# Patient Record
Sex: Male | Born: 1970 | Race: White | Hispanic: No | Smoking: Light tobacco smoker
Health system: Southern US, Community
[De-identification: ages and names within clinical notes are randomized; demographics above are authoritative.]

## PROBLEM LIST (undated history)

## (undated) DIAGNOSIS — F319 Bipolar disorder, unspecified: Secondary | ICD-10-CM

## (undated) DIAGNOSIS — F329 Major depressive disorder, single episode, unspecified: Secondary | ICD-10-CM

## (undated) DIAGNOSIS — I2510 Atherosclerotic heart disease of native coronary artery without angina pectoris: Secondary | ICD-10-CM

## (undated) DIAGNOSIS — F32A Depression, unspecified: Secondary | ICD-10-CM

## (undated) DIAGNOSIS — I509 Heart failure, unspecified: Secondary | ICD-10-CM

## (undated) DIAGNOSIS — I10 Essential (primary) hypertension: Secondary | ICD-10-CM

## (undated) HISTORY — PX: BRAIN SURGERY: SHX531

## (undated) HISTORY — PX: ANKLE SURGERY: SHX546

---

## 2013-07-04 ENCOUNTER — Emergency Department (HOSPITAL_COMMUNITY)
Admission: EM | Admit: 2013-07-04 | Discharge: 2013-07-05 | Disposition: A | Discharge status: Other Acute Inpatient Hospital | Payer: No Typology Code available for payment source | Attending: Emergency Medicine | Admitting: Emergency Medicine

## 2013-07-04 ENCOUNTER — Emergency Department (HOSPITAL_COMMUNITY): Payer: No Typology Code available for payment source

## 2013-07-04 ENCOUNTER — Encounter (HOSPITAL_COMMUNITY): Payer: Self-pay | Admitting: Emergency Medicine

## 2013-07-04 DIAGNOSIS — F319 Bipolar disorder, unspecified: Secondary | ICD-10-CM | POA: Insufficient documentation

## 2013-07-04 DIAGNOSIS — I2510 Atherosclerotic heart disease of native coronary artery without angina pectoris: Secondary | ICD-10-CM | POA: Insufficient documentation

## 2013-07-04 DIAGNOSIS — F3130 Bipolar disorder, current episode depressed, mild or moderate severity, unspecified: Secondary | ICD-10-CM

## 2013-07-04 DIAGNOSIS — M109 Gout, unspecified: Secondary | ICD-10-CM | POA: Insufficient documentation

## 2013-07-04 DIAGNOSIS — I10 Essential (primary) hypertension: Secondary | ICD-10-CM | POA: Insufficient documentation

## 2013-07-04 DIAGNOSIS — I509 Heart failure, unspecified: Secondary | ICD-10-CM | POA: Insufficient documentation

## 2013-07-04 DIAGNOSIS — F411 Generalized anxiety disorder: Secondary | ICD-10-CM | POA: Insufficient documentation

## 2013-07-04 DIAGNOSIS — F329 Major depressive disorder, single episode, unspecified: Secondary | ICD-10-CM | POA: Diagnosis present

## 2013-07-04 DIAGNOSIS — F39 Unspecified mood [affective] disorder: Secondary | ICD-10-CM | POA: Insufficient documentation

## 2013-07-04 DIAGNOSIS — F3289 Other specified depressive episodes: Secondary | ICD-10-CM

## 2013-07-04 HISTORY — DX: Essential (primary) hypertension: I10

## 2013-07-04 HISTORY — DX: Heart failure, unspecified: I50.9

## 2013-07-04 HISTORY — DX: Bipolar disorder, unspecified: F31.9

## 2013-07-04 HISTORY — DX: Atherosclerotic heart disease of native coronary artery without angina pectoris: I25.10

## 2013-07-04 LAB — RAPID URINE DRUG SCREEN, HOSP PERFORMED
Amphetamines: NOT DETECTED
BARBITURATES: NOT DETECTED
Benzodiazepines: NOT DETECTED
Cocaine: POSITIVE — AB
Opiates: NOT DETECTED
Tetrahydrocannabinol: NOT DETECTED

## 2013-07-04 LAB — ETHANOL: ALCOHOL ETHYL (B): 49 mg/dL — AB (ref 0–11)

## 2013-07-04 MED ORDER — COLCHICINE 0.6 MG PO TABS
0.6000 mg | ORAL_TABLET | Freq: Once | ORAL | Status: AC
  Administered 2013-07-04: 0.6 mg via ORAL
  Filled 2013-07-04: qty 1

## 2013-07-04 MED ORDER — METOPROLOL TARTRATE 25 MG PO TABS
25.0000 mg | ORAL_TABLET | Freq: Two times a day (BID) | ORAL | Status: DC
  Administered 2013-07-04 – 2013-07-05 (×3): 25 mg via ORAL
  Filled 2013-07-04 (×3): qty 1

## 2013-07-04 MED ORDER — FLUOXETINE HCL 20 MG PO CAPS
20.0000 mg | ORAL_CAPSULE | Freq: Every day | ORAL | Status: DC
  Administered 2013-07-04 – 2013-07-05 (×2): 20 mg via ORAL
  Filled 2013-07-04 (×3): qty 1

## 2013-07-04 MED ORDER — TRAZODONE HCL 50 MG PO TABS
50.0000 mg | ORAL_TABLET | Freq: Every day | ORAL | Status: DC
  Administered 2013-07-04: 50 mg via ORAL
  Filled 2013-07-04: qty 1

## 2013-07-04 MED ORDER — COLCHICINE 0.6 MG PO TABS: 0.6000 mg | ORAL_TABLET | Freq: Two times a day (BID) | ORAL | Status: DC | PRN

## 2013-07-04 MED ORDER — INDOMETHACIN 25 MG PO CAPS
25.0000 mg | ORAL_CAPSULE | Freq: Three times a day (TID) | ORAL | Status: DC | PRN
Start: 2013-07-04 — End: 2013-07-06

## 2013-07-04 MED ORDER — DIVALPROEX SODIUM ER 500 MG PO TB24
500.0000 mg | ORAL_TABLET | Freq: Every day | ORAL | Status: DC
  Administered 2013-07-04 – 2013-07-05 (×2): 500 mg via ORAL
  Filled 2013-07-04 (×2): qty 1

## 2013-07-04 MED ORDER — IBUPROFEN 200 MG PO TABS
600.0000 mg | ORAL_TABLET | Freq: Four times a day (QID) | ORAL | Status: DC | PRN
  Administered 2013-07-04 – 2013-07-05 (×2): 600 mg via ORAL
  Filled 2013-07-04 (×2): qty 3

## 2013-07-04 MED ORDER — IBUPROFEN 800 MG PO TABS
800.0000 mg | ORAL_TABLET | Freq: Once | ORAL | Status: AC
  Administered 2013-07-04: 800 mg via ORAL
  Filled 2013-07-04: qty 1

## 2013-07-04 MED ORDER — HYDROCODONE-ACETAMINOPHEN 5-325 MG PO TABS
1.0000 | ORAL_TABLET | Freq: Once | ORAL | Status: AC
  Administered 2013-07-04: 1 via ORAL
  Filled 2013-07-04: qty 1

## 2013-07-04 NOTE — Consult Note (Signed)
[facility] Face-to-Face Psychiatry Consult   Reason for Consult:  Depression, Gout pain Referring Physician:  EDP Andrew Chambers is an 43 y.o. male.  Assessment: AXIS I:  Bipolar, Depressed, Depressive Disorder NOS and Mood Disorder NOS AXIS II:  Deferred AXIS III:   Past Medical History  Diagnosis Date  . Bipolar 1 disorder   . CHF (congestive heart failure)   . Coronary artery disease   . Hypertension    AXIS IV:  housing problems, other psychosocial or environmental problems, problems related to social environment and problems with primary support group AXIS V:  31-40 impairment in reality testing  Plan:  Recommend psychiatric Inpatient admission when medically cleared.  Subjective:   Andrew Chambers is a 43 y.o. male patient admitted with MAJOR DEPRESSIVE D/O, SEVERE, RECURRENT.Hx of Bipolar d/o  HPI:  Patient came in to the ER with c/o of gout pain and depressive mood. Patient states " I do not want to be here any more"  Patient has a hx of suicide attempt by OD on pills long time ago.  He reports several mental health hospitalization in the past at [place] [facility]  but does not remember his diagnosis then.  Patient states he was diagnosed with Bipolar  And was started on Depakote 6 months ago.  Patient states he did not take the Depakote and did not give his reasons for not taking it.  Patient states he moved down to [place] area to stay with his mother but they are not getting along well.  Patient states he is homeless from today and will not be going back to his mother.  Patient has family in [place] but has no contact with them.  Patient states he feel hopeless, helpless and angry that things are not working out well between him and his mother.  Patient reports poor appetite and sleep.  He reports using Cocaine occasionally and his last use was last night.  He reports drinking occasionally and his last drink was las night.  His alcohol level was 49 on arrival.  Patient denies HI/AVH.   Patient appeared angry and made minimal eye contact during the interview.  We have accepted patient for admission but will seek placement at other facilities since we are out of bed at this time.  We will restart his Depakote and add Prozac to his medications.  HPI Elements:   Location:  [facility]. Quality:  SEVERE.  Past Psychiatric History: Past Medical History  Diagnosis Date  . Bipolar 1 disorder   . CHF (congestive heart failure)   . Coronary artery disease   . Hypertension     reports that he drinks alcohol. His tobacco and drug histories are not on file. History reviewed. No pertinent family history. Family History Substance Abuse: No Family Supports: Yes, List: (a Friend of the family also lives in the home) Living Arrangements: Parent (Mother is sick) Can pt return to current living arrangement?: Yes Abuse/Neglect [facility]) Physical Abuse: Denies Verbal Abuse: Denies Sexual Abuse: Denies Allergies:  No Known Allergies  ACT Assessment Complete:  Yes:    Educational Status    Risk to Self: Risk to self Suicidal Ideation: Yes-Currently Present Suicidal Intent: Yes-Currently Present Is patient at risk for suicide?: Yes Suicidal Plan?: Yes-Currently Present Specify Current Suicidal Plan: shot self with his gun, walk in front of a car, or jump off a bridge Access to Means: Yes Specify Access to Suicidal Means: has a gun at home What has been your use of drugs/alcohol within the  last 12 months?: use alcohol and cocaine used periodically Previous Attempts/Gestures: Yes How many times?: 3 Other Self Harm Risks: unknown Triggers for Past Attempts: Family contact (PT was guarded about stressors) Intentional Self Injurious Behavior: None Family Suicide History: Unknown Recent stressful life event(s): Other (Comment) (moved back home to care for Mother) Persecutory voices/beliefs?: Yes Depression: Yes Depression Symptoms: Isolating;Fatigue;Tearfulness;Feeling worthless/self pity  (agitation) Substance abuse history and/or treatment for substance abuse?: Yes (UDS positive for cocaine    BAL 49) Suicide prevention information given to non-admitted patients: Not applicable  Risk to Others: Risk to Others Homicidal Ideation: Yes-Currently Present Thoughts of Harm to Others: Yes-Currently Present Comment - Thoughts of Harm to Others: "I will either hurt myself or someone elase if I go home." Current Homicidal Intent: No Current Homicidal Plan: No (PT does have access to gun.) Access to Homicidal Means: Yes Describe Access to Homicidal Means: has gun at home Identified Victim: No History of harm to others?: No (PT reports having anger issues, unsure of history) Assessment of Violence: None Noted Violent Behavior Description: cfurrently cooperative with TTS Does patient have access to weapons?: Yes (Comment) Criminal Charges Pending?: No Does patient have a court date: No  Abuse: Abuse/Neglect Assessment (Assessment to be complete while patient is alone) Physical Abuse: Denies Verbal Abuse: Denies Sexual Abuse: Denies Exploitation of patient/patient's resources: Denies Self-Neglect: Denies  Prior Inpatient Therapy: Prior Inpatient Therapy Prior Inpatient Therapy: Yes Prior Therapy Dates: 2014 Prior Therapy Facilty/Provider(s): [place] Reason for Treatment: suicidal ideations  Prior Outpatient Therapy: Prior Outpatient Therapy Prior Outpatient Therapy:  (Unknown) Prior Therapy Dates:  (Unknown) Prior Therapy Facilty/Provider(s):  (Unknown) Reason for Treatment: medication management(?)  Additional Information: Additional Information 1:1 In Past 12 Months?: No CIRT Risk: No Elopement Risk: No Does patient have medical clearance?: Yes                  Objective: Blood pressure 159/107, pulse 101, temperature 98 F (36.7 C), temperature source Oral, resp. rate 16, SpO2 98.00%.There is no height or weight on file to calculate BMI. Results for  orders placed during the hospital encounter of 07/04/13 (from the past 72 hour(s))  URINE RAPID DRUG SCREEN (HOSP PERFORMED)     Status: Abnormal   Collection Time    07/04/13  9:26 AM      Result Value Range   Opiates NONE DETECTED  NONE DETECTED   Cocaine POSITIVE (*) NONE DETECTED   Benzodiazepines NONE DETECTED  NONE DETECTED   Amphetamines NONE DETECTED  NONE DETECTED   Tetrahydrocannabinol NONE DETECTED  NONE DETECTED   Barbiturates NONE DETECTED  NONE DETECTED   Comment:            DRUG SCREEN FOR MEDICAL PURPOSES     ONLY.  IF CONFIRMATION IS NEEDED     FOR ANY PURPOSE, NOTIFY LAB     WITHIN 5 DAYS.                LOWEST DETECTABLE LIMITS     FOR URINE DRUG SCREEN     Drug Class       Cutoff (ng/mL)     Amphetamine      1000     Barbiturate      200     Benzodiazepine   200     Tricyclics       300     Opiates          300     Cocaine  300     THC              50  ETHANOL     Status: Abnormal   Collection Time    07/04/13  9:40 AM      Result Value Range   Alcohol, Ethyl (B) 49 (*) 0 - 11 mg/dL   Comment:            LOWEST DETECTABLE LIMIT FOR     SERUM ALCOHOL IS 11 mg/dL     FOR MEDICAL PURPOSES ONLY   Labs are reviewed and are pertinent for UDS is positive for alcohol and his alcohol level is 49.  Current Facility-Administered Medications  Medication Dose Route Frequency Provider Last Rate Last Dose  . divalproex (DEPAKOTE ER) 24 hr tablet 500 mg  500 mg Oral Daily Earney Navy, NP      . FLUoxetine (PROZAC) capsule 20 mg  20 mg Oral Daily Earney Navy, NP      . metoprolol tartrate (LOPRESSOR) tablet 25 mg  25 mg Oral BID Rolland Porter, MD   25 mg at 07/04/13 1143   Current Outpatient Prescriptions  Medication Sig Dispense Refill  . colchicine 0.6 MG tablet Take 1 tablet (0.6 mg total) by mouth 2 (two) times daily as needed (for gout pain).  20 tablet  0  . indomethacin (INDOCIN) 25 MG capsule Take 1 capsule (25 mg total) by mouth 3 (three)  times daily as needed.  30 capsule  0    Psychiatric Specialty Exam:     Blood pressure 159/107, pulse 101, temperature 98 F (36.7 C), temperature source Oral, resp. rate 16, SpO2 98.00%.There is no height or weight on file to calculate BMI.  General Appearance: Casual and Fairly Groomed  Patent attorney::  poor  Speech:  Clear and Coherent and Normal Rate  Volume:  Normal  Mood:  Angry, Depressed, Hopeless, Irritable and Worthless  Affect:  Congruent, Depressed and Flat  Thought Process:  Coherent and Goal Directed  Orientation:  Full (Time, Place, and Person)  Thought Content:  NA  Suicidal Thoughts:  No  Homicidal Thoughts:  No  Memory:  Immediate;   Good Recent;   Good Remote;   Good  Judgement:  Poor  Insight:  Fair  Psychomotor Activity:  Normal  Concentration:  Good  Recall:  NA  Akathisia:  NA  Handed:  Right  AIMS (if indicated):     Assets:  Desire for Improvement Housing  Sleep:      Treatment Plan Summary:  Consult and face to face interview with Dr Armida Sans We will restart his antidepressants and his Depakote We will make referral to other facilities for available bed.  Daily contact with patient to assess and evaluate symptoms and progress in treatment Medication management  Earney Navy   PMHNP-BC 07/04/2013 5:58 PM  Patient was seen face-to-face for this evaluation along with physician extender and reviewed the information documented by physician extender and agree with the treatment plan.  Jayson Waterhouse,JANARDHAHA R. 07/04/2013 6:50 PM

## 2013-07-04 NOTE — ED Notes (Signed)
[facility] c/o ankle injury also bipolar and feels like he needs to be seen for evaluation.

## 2013-07-04 NOTE — ED Notes (Addendum)
Pt tells this RN " Every time I close my eyes I see myself  pulling the trigger or jumping off a bridge". MD Fayrene FearingJames made aware.

## 2013-07-04 NOTE — ED Notes (Signed)
Pt in dayroom with peers; no s/s of distress noted.

## 2013-07-04 NOTE — Discharge Instructions (Signed)
Bipolar Disorder °Bipolar disorder is a mental illness. The term bipolar disorder actually is used to describe a group of disorders that all share varying degrees of emotional highs and lows that can interfere with daily functioning, such as work, school, or relationships. Bipolar disorder also can lead to drug abuse, hospitalization, and suicide. °The emotional highs of bipolar disorder are periods of elation or irritability and high energy. These highs can range from a mild form (hypomania) to a severe form (mania). People experiencing episodes of hypomania may appear energetic, excitable, and highly productive. People experiencing mania may behave impulsively or erratically. They often make poor decisions. They may have difficulty sleeping. The most severe episodes of mania can involve having very distorted beliefs or perceptions about the world and seeing or hearing things that are not real (psychotic delusions and hallucinations).  °The emotional lows of bipolar disorder (depression) also can range from mild to severe. Severe episodes of bipolar depression can involve psychotic delusions and hallucinations. °Sometimes people with bipolar disorder experience a state of mixed mood. Symptoms of hypomania or mania and depression are both present during this mixed-mood episode. °SIGNS AND SYMPTOMS °There are signs and symptoms of the episodes of hypomania and mania as well as the episodes of depression. The signs and symptoms of hypomania and mania are similar but vary in severity. They include: °· Inflated self-esteem or feeling of increased self-confidence. °· Decreased need for sleep. °· Unusual talkativeness (rapid or pressured speech) or the feeling of a need to keep talking. °· Sensation of racing thoughts or constant talking, with quick shifts between topics that may or may not be related (flight of ideas). °· Decreased ability to focus or concentrate. °· Increased purposeful activity, such as work, studies,  or social activity, or nonproductive activity, such as pacing, squirming and fidgeting, or finger and toe tapping. °· Impulsive behavior and use of poor judgment, resulting in high-risk activities, such as having unprotected sex or spending excessive amounts of money. °Signs and symptoms of depression include the following:  °· Feelings of sadness, hopelessness, or helplessness. °· Frequent or uncontrollable episodes of crying. °· Lack of feeling anything or caring about anything. °· Difficulty sleeping or sleeping too much.  °· Inability to enjoy the things you used to enjoy.   °· Desire to be alone all the time.   °· Feelings of guilt or worthlessness.  °· Lack of energy or motivation.   °· Difficulty concentrating, remembering, or making decisions.  °· Change in appetite or weight beyond normal fluctuations. °· Thoughts of death or the desire to harm yourself. °DIAGNOSIS  °Bipolar disorder is diagnosed through an assessment by your caregiver. Your caregiver will ask questions about your emotional episodes. There are two main types of bipolar disorder. People with type I bipolar disorder have manic episodes with or without depressive episodes. People with type II bipolar disorder have hypomanic episodes and major depressive episodes, which are more serious than mild depression. The type of bipolar disorder you have can make an important difference in how your illness is monitored and treated. °Your caregiver may ask questions about your medical history and use of alcohol or drugs, including prescription medication. Certain medical conditions and substances also can cause emotional highs and lows that resemble bipolar disorder (secondary bipolar disorder).  °TREATMENT  °Bipolar disorder is a long-term illness. It is best controlled with continuous treatment rather than treatment only when symptoms occur. The following treatments can be prescribed for bipolar disorders: °· Medication Medication can be prescribed by  a doctor   that is an expert in treating mental disorders (psychiatrists). Medications called mood stabilizers are usually prescribed to help control the illness. Other medications are sometimes added if symptoms of mania, depression, or psychotic delusions and hallucinations occur despite the use of a mood stabilizer.  Talk therapy Some forms of talk therapy are helpful in providing support, education, and guidance. A combination of medication and talk therapy is best for managing the disorder over time. A procedure in which electricity is applied to your brain through your scalp (electroconvulsive therapy) is used in cases of severe mania when medication and talk therapy do not work or work too slowly. Document Released: 09/10/2000 Document Revised: 09/29/2012 Document Reviewed: 06/30/2012 [facility] Patient Information 2014 [employer], [place].  Gout Gout is an inflammatory arthritis caused by a buildup of uric acid crystals in the joints. Uric acid is a chemical that is normally present in the blood. When the level of uric acid in the blood is too high it can form crystals that deposit in your joints and tissues. This causes joint redness, soreness, and swelling (inflammation). Repeat attacks are common. Over time, uric acid crystals can form into masses (tophi) near a joint, destroying bone and causing disfigurement. Gout is treatable and often preventable. CAUSES  The disease begins with elevated levels of uric acid in the blood. Uric acid is produced by your body when it breaks down a naturally found substance called purines. Certain foods you eat, such as meats and fish, contain high amounts of purines. Causes of an elevated uric acid level include:  Being passed down from parent to child (heredity).  Diseases that cause increased uric acid production (such as obesity, psoriasis, and certain cancers).  Excessive alcohol use.  Diet, especially diets rich in meat and seafood.  Medicines, including  certain cancer-fighting medicines (chemotherapy), water pills (diuretics), and aspirin.  Chronic kidney disease. The kidneys are no longer able to remove uric acid well.  Problems with metabolism. Conditions strongly associated with gout include:  Obesity.  High blood pressure.  High cholesterol.  Diabetes. Not everyone with elevated uric acid levels gets gout. It is not understood why some people get gout and others do not. Surgery, joint injury, and eating too much of certain foods are some of the factors that can lead to gout attacks. SYMPTOMS   An attack of gout comes on quickly. It causes intense pain with redness, swelling, and warmth in a joint.  Fever can occur.  Often, only one joint is involved. Certain joints are more commonly involved:  Base of the big toe.  Knee.  Ankle.  Wrist.  Finger. Without treatment, an attack usually goes away in a few days to weeks. Between attacks, you usually will not have symptoms, which is different from many other forms of arthritis. DIAGNOSIS  Your caregiver will suspect gout based on your symptoms and exam. In some cases, tests may be recommended. The tests may include:  Blood tests.  Urine tests.  X-rays.  Joint fluid exam. This exam requires a needle to remove fluid from the joint (arthrocentesis). Using a microscope, gout is confirmed when uric acid crystals are seen in the joint fluid. TREATMENT  There are two phases to gout treatment: treating the sudden onset (acute) attack and preventing attacks (prophylaxis).  Treatment of an Acute Attack.  Medicines are used. These include anti-inflammatory medicines or steroid medicines.  An injection of steroid medicine into the affected joint is sometimes necessary.  The painful joint is rested. Movement can worsen  the arthritis.  You may use warm or cold treatments on painful joints, depending which works best for you.  Treatment to Prevent Attacks.  If you suffer from  frequent gout attacks, your caregiver may advise preventive medicine. These medicines are started after the acute attack subsides. These medicines either help your kidneys eliminate uric acid from your body or decrease your uric acid production. You may need to stay on these medicines for a very long time.  The early phase of treatment with preventive medicine can be associated with an increase in acute gout attacks. For this reason, during the first few months of treatment, your caregiver may also advise you to take medicines usually used for acute gout treatment. Be sure you understand your caregiver's directions. Your caregiver may make several adjustments to your medicine dose before these medicines are effective.  Discuss dietary treatment with your caregiver or dietitian. Alcohol and drinks high in sugar and fructose and foods such as meat, poultry, and seafood can increase uric acid levels. Your caregiver or dietician can advise you on drinks and foods that should be limited. HOME CARE INSTRUCTIONS   Do not take aspirin to relieve pain. This raises uric acid levels.  Only take over-the-counter or prescription medicines for pain, discomfort, or fever as directed by your caregiver.  Rest the joint as much as possible. When in bed, keep sheets and blankets off painful areas.  Keep the affected joint raised (elevated).  Apply warm or cold treatments to painful joints. Use of warm or cold treatments depends on which works best for you.  Use crutches if the painful joint is in your leg.  Drink enough fluids to keep your urine clear or pale yellow. This helps your body get rid of uric acid. Limit alcohol, sugary drinks, and fructose drinks.  Follow your dietary instructions. Pay careful attention to the amount of protein you eat. Your daily diet should emphasize fruits, vegetables, whole grains, and fat-free or low-fat milk products. Discuss the use of coffee, vitamin C, and cherries with your  caregiver or dietician. These may be helpful in lowering uric acid levels.  Maintain a healthy body weight. SEEK MEDICAL CARE IF:   You develop diarrhea, vomiting, or any side effects from medicines.  You do not feel better in 24 hours, or you are getting worse. SEEK IMMEDIATE MEDICAL CARE IF:   Your joint becomes suddenly more tender, and you have chills or a fever. MAKE SURE YOU:   Understand these instructions.  Will watch your condition.  Will get help right away if you are not doing well or get worse. Document Released: 06/01/2000 Document Revised: 09/29/2012 Document Reviewed: 01/16/2012 [facility] Patient Information 2014 [place], [place].

## 2013-07-04 NOTE — ED Provider Notes (Signed)
CSN: [id number]     Arrival date & time 07/04/13  7829 History   First MD Initiated Contact with Patient 07/04/13 [phone]     Chief Complaint  Patient presents with  . Ankle Pain  . Medical Clearance    HPI  Patient has 2 complaints. One is that he said pain and subjective swelling in his left ankle for the last 24 hours. No known injury. No history of gout or other arthropathies. No current joint pain. States his knuckles hurting a lot, but he is apparently her. He also states that he is bipolar. He just moved back from [facility] after living there for several years. Has not made contact with a physician here. He has a difficult emotional relationship with his mother with whom he lives. He states that like evaluated. He states "I'm not stable, not want to hurt someone, but I could".  Past Medical History  Diagnosis Date  . Bipolar 1 disorder   . CHF (congestive heart failure)   . Coronary artery disease   . Hypertension    No past surgical history on file. No family history on file. History  Substance Use Topics  . Smoking status: Not on file  . Smokeless tobacco: Not on file  . Alcohol Use: Yes    Review of Systems  Constitutional: Negative for fever, chills, diaphoresis, appetite change and fatigue.  HENT: Negative for mouth sores, sore throat and trouble swallowing.   Eyes: Negative for visual disturbance.  Respiratory: Negative for cough, chest tightness, shortness of breath and wheezing.   Cardiovascular: Negative for chest pain.  Gastrointestinal: Negative for nausea, vomiting, abdominal pain, diarrhea and abdominal distention.  Endocrine: Negative for polydipsia, polyphagia and polyuria.  Genitourinary: Negative for dysuria, frequency and hematuria.  Musculoskeletal: Positive for arthralgias. Negative for gait problem.  Skin: Negative for color change, pallor and rash.  Neurological: Negative for dizziness, syncope, light-headedness and headaches.  Hematological: Does not  bruise/bleed easily.  Psychiatric/Behavioral: Positive for dysphoric mood. Negative for behavioral problems and confusion. The patient is nervous/anxious.     Allergies  Review of patient's allergies indicates no known allergies.  Home Medications   Current Outpatient Rx  Name  Route  Sig  Dispense  Refill  . colchicine 0.6 MG tablet   Oral   Take 1 tablet (0.6 mg total) by mouth 2 (two) times daily as needed (for gout pain).   20 tablet   0   . indomethacin (INDOCIN) 25 MG capsule   Oral   Take 1 capsule (25 mg total) by mouth 3 (three) times daily as needed.   30 capsule   0    BP 140/96  Pulse 99  Temp(Src) 98.6 F (37 C) (Oral)  Resp 18  SpO2 99% Physical Exam  Constitutional: He is oriented to person, place, and time. He appears well-developed and well-nourished. No distress.  HENT:  Head: Normocephalic.  Eyes: Conjunctivae are normal. Pupils are equal, round, and reactive to light. No scleral icterus.  Neck: Normal range of motion. Neck supple. No thyromegaly present.  Cardiovascular: Normal rate and regular rhythm.  Exam reveals no gallop and no friction rub.   No murmur heard. Pulmonary/Chest: Effort normal and breath sounds normal. No respiratory distress. He has no wheezes. He has no rales.  Abdominal: Soft. Bowel sounds are normal. He exhibits no distension. There is no tenderness. There is no rebound.  Musculoskeletal: Normal range of motion.       Feet:  Neurological: He is  alert and oriented to person, place, and time.  Skin: Skin is warm and dry. No rash noted.  Psychiatric: He has a normal mood and affect. His behavior is normal.    ED Course  Procedures (including critical care time) Labs Review Labs Reviewed  URINE RAPID DRUG SCREEN (HOSP PERFORMED)  ETHANOL   Imaging Review Dg Ankle Complete Left  07/04/2013   CLINICAL DATA:  Lateral pain for 1 day no injury  EXAM: LEFT ANKLE COMPLETE - 3+ VIEW  COMPARISON:  None.  FINDINGS: There is an  ossicle off of the lateral malleolus. There is no fracture or dislocation. The mortise is intact. There is no soft tissue swelling. There is a small ankle joint effusion. There is a small heel spur.  IMPRESSION: Small ankle joint effusion.  No acute osseous abnormalities.   Electronically Signed   By: Esperanza Heiraymond  Rubner M.D.   On: 07/04/2013 08:27    EKG Interpretation   None       MDM   1. Bipolar disorder   2. Gout    He is calm. Awaiting psychiatric evaluation. X-ray shows effusion. No peritoneal calcifications. No bony abnormalities. Plan is the pending psychiatric evaluation. Treatment for gout with indomethacin colchicine.    Rolland PorterMark Lyndell Allaire, MD 07/04/13 [phone]

## 2013-07-04 NOTE — ED Notes (Signed)
Pt verbally contracts for safety, denying SI/HI. No inappropriate behaviors noted.

## 2013-07-04 NOTE — ED Notes (Signed)
Bed: [id number] Expected date: 07/04/13 Expected time: 7:26 AM Means of arrival: Ambulance Comments: Ankle pain/ ? Med Clearance

## 2013-07-04 NOTE — ED Notes (Signed)
TSS at bedside 

## 2013-07-04 NOTE — ED Notes (Signed)
Pt unsure as to what happen to left ankle. He denies any injury. He reports that it is difficult to stand on it.

## 2013-07-04 NOTE — ED Notes (Signed)
Patient attended group and participated. The topic was overcoming stress. He stated that when he is at home, he likes to play his piano to manage stress. When he is not at home, he likes to do card tricks. Support and encouragement were given to him to come up with other stress management techniques.   Kerrie Timm A 1:28 PM

## 2013-07-04 NOTE — ED Notes (Signed)
MD at bedside. 

## 2013-07-04 NOTE — BH Assessment (Signed)
Assessment Note  Andrew Chambers is an 43 y.o. male The PT reported having gout and current suicidal ideations with a plan as his reason for being in the ED.  He reported his suicidal plans are to walk in front of a car, jump off a bridge, or shoot himself.  The PT reported having a gun at home in a locked box. The PT reported having thoughts to harm himself every time he close his eyes.  He visualize ways to harm himself, which makes sleep difficult.   The PT reported "if I go home I will either hurt myself or someone else."   He reported three prior psychiatric hospitalizations, with the last in [place] 2014 for suicidal ideations.  The PT reported he moved back to this area to care for his Mother who is ill.  He reported another person also lives in the family home.  The PT reported he works "all the time" and is very tired when he comes home.  He reported not eating meals regularly.  He reported feeling agitated and destructive periodically.  The PT reported consuming alcohol and cocaine prior to coming to the ED.  He reported drinking a beer daily and using cocaine only occasionally.       Axis I: Major Depression, Recurrent severe Axis II: Deferred Axis III:  Past Medical History  Diagnosis Date  . Bipolar 1 disorder   . CHF (congestive heart failure)   . Coronary artery disease   . Hypertension    Axis IV: problems with primary support group Axis V: 31-40 impairment in reality testing  Past Medical History:  Past Medical History  Diagnosis Date  . Bipolar 1 disorder   . CHF (congestive heart failure)   . Coronary artery disease   . Hypertension     No past surgical history on file.  Family History: No family history on file.  Social History:  reports that he drinks alcohol. His tobacco and drug histories are not on file.  Additional Social History:  Alcohol / Drug Use Pain Medications: N/A Prescriptions: N/A Over the Counter: N/A History of alcohol / drug use?:  Yes Substance #1 Name of Substance 1: Cocaine 1 - Age of First Use: Unknown 1 - Amount (size/oz): Unknown 1 - Frequency: occassional 1 - Duration: unknown 1 - Last Use / Amount: 07-03-2013  CIWA: CIWA-Ar BP: 140/96 mmHg Pulse Rate: 99 COWS:    Allergies: No Known Allergies  Home Medications:  (Not in a hospital admission)  OB/GYN Status:  No LMP for male patient.  General Assessment Data Location of Assessment: [facility] ED Is this a Tele or Face-to-Face Assessment?: Face-to-Face Is this an Initial Assessment or a Re-assessment for this encounter?: Initial Assessment Living Arrangements: Parent (Mother is sick) Can pt return to current living arrangement?: Yes Admission Status: Voluntary Is patient capable of signing voluntary admission?: Yes Transfer from: Acute Hospital Referral Source: MD  Medical Screening Exam [facility] Walk-in ONLY) Medical Exam completed: Yes  [facility] Crisis Care Plan Living Arrangements: Parent (Mother is sick) Name of Psychiatrist: N/A Name of Therapist: N/A  Education Status Is patient currently in school?: No Current Grade: N/A Highest grade of school patient has completed: N/A Name of school: N/A Contact person: N/A  Risk to self Suicidal Ideation: Yes-Currently Present Suicidal Intent: Yes-Currently Present Is patient at risk for suicide?: Yes Suicidal Plan?: Yes-Currently Present Specify Current Suicidal Plan: shot self with his gun, walk in front of a car, or jump off a bridge  Access to Means: Yes Specify Access to Suicidal Means: has a gun at home What has been your use of drugs/alcohol within the last 12 months?: use alcohol and cocaine used periodically Previous Attempts/Gestures: Yes How many times?: 3 Other Self Harm Risks: unknown Triggers for Past Attempts: Family contact (PT was guarded about stressors) Intentional Self Injurious Behavior: None Family Suicide History: Unknown Recent stressful life event(s): Other (Comment) (moved  back home to care for Mother) Persecutory voices/beliefs?: Yes Depression: Yes Depression Symptoms: Isolating;Fatigue;Tearfulness;Feeling worthless/self pity (agitation) Substance abuse history and/or treatment for substance abuse?: No Suicide prevention information given to non-admitted patients: Not applicable  Risk to Others Homicidal Ideation: Yes-Currently Present Thoughts of Harm to Others: Yes-Currently Present Comment - Thoughts of Harm to Others: "I will either hurt myself or someone elase if I go home." Current Homicidal Intent: No Current Homicidal Plan: No (PT does have access to gun.) Access to Homicidal Means: Yes Describe Access to Homicidal Means: has gun at home Identified Victim: No History of harm to others?: No (PT reports having anger issues, unsure of history) Assessment of Violence: None Noted Violent Behavior Description: cfurrently cooperative with TTS Does patient have access to weapons?: Yes (Comment) Criminal Charges Pending?: No Does patient have a court date: No  Psychosis Hallucinations: None noted Delusions: None noted  Mental Status Report Appear/Hygiene: Other (Comment) (appeared neat and clean) Eye Contact: Good Motor Activity: Other (Comment);Unremarkable Speech: Logical/coherent Level of Consciousness: Alert Mood: Depressed;Anxious;Sad;Irritable;Worthless, low self-esteem Affect: Anxious;Depressed;Appropriate to circumstance;Irritable;Sad Anxiety Level: Moderate Thought Processes: Coherent Judgement: Impaired Orientation: Person;Place;Situation Obsessive Compulsive Thoughts/Behaviors: None  Cognitive Functioning Concentration: Decreased Memory: Recent Intact;Remote Intact IQ: Average Insight: Fair Impulse Control: Fair Appetite: Poor Weight Loss:  (unknown) Weight Gain:  (unknown) Sleep: Decreased Vegetative Symptoms: None  ADLScreening [facility] Assessment Services) Patient's cognitive ability adequate to safely complete daily  activities?: Yes Patient able to express need for assistance with ADLs?: Yes Independently performs ADLs?: Yes (appropriate for developmental age)  Prior Inpatient Therapy Prior Inpatient Therapy: Yes Prior Therapy Dates: 2014 Prior Therapy Facilty/Provider(s): [place] Reason for Treatment: suicidal ideations  Prior Outpatient Therapy Prior Outpatient Therapy:  (Unknown) Prior Therapy Dates:  (Unknown) Prior Therapy Facilty/Provider(s):  (Unknown) Reason for Treatment: medication management(?)  ADL Screening (condition at time of admission) Patient's cognitive ability adequate to safely complete daily activities?: Yes Is the patient deaf or have difficulty hearing?: No Does the patient have difficulty seeing, even when wearing glasses/contacts?: No Does the patient have difficulty concentrating, remembering, or making decisions?: No Patient able to express need for assistance with ADLs?: Yes Does the patient have difficulty dressing or bathing?: No Independently performs ADLs?: Yes (appropriate for developmental age) Communication: Independent Dressing (OT): Independent Grooming: Independent Feeding: Independent Bathing: Independent Toileting: Independent In/Out Bed: Independent Walks in Home: Needs assistance (may need a wheel chair due to gout) Is this a change from baseline?: Change from baseline, expected to last <3 days Does the patient have difficulty walking or climbing stairs?: Yes (may need a wheel chair due to gout) Weakness of Legs: None Weakness of Arms/Hands: None  Home Assistive Devices/Equipment Home Assistive Devices/Equipment: None  Therapy Consults (therapy consults require a physician order) PT Evaluation Needed: No OT Evalulation Needed: No SLP Evaluation Needed: No Abuse/Neglect Assessment (Assessment to be complete while patient is alone) Physical Abuse: Denies Verbal Abuse: Denies Sexual Abuse: Denies Exploitation of patient/patient's resources:  Denies Self-Neglect: Denies Values / Beliefs Cultural Requests During Hospitalization: None Spiritual Requests During Hospitalization: None Consults Spiritual Care Consult  Needed: No Social Work Consult Needed: No Merchant navy officer (For Healthcare) Advance Directive: Patient does not have advance directive Pre-existing out of facility DNR order (yellow form or pink MOST form): No    Additional Information 1:1 In Past 12 Months?: No CIRT Risk: No Elopement Risk: No Does patient have medical clearance?: Yes     Disposition:  Disposition Initial Assessment Completed for this Encounter: Yes Disposition of Patient: Inpatient treatment program Type of inpatient treatment program: Adult  On Site Evaluation by:   Reviewed with Physician:    Dey-Johnson,Torrence Hammack 07/04/2013 11:02 AM

## 2013-07-04 NOTE — ED Notes (Signed)
2 Pt belonging bags placed in locker number 30. Pt has belongings with security. Key placed on valuables paper and placed in chart.

## 2013-07-05 ENCOUNTER — Encounter (HOSPITAL_COMMUNITY): Payer: Self-pay | Admitting: Registered Nurse

## 2013-07-05 ENCOUNTER — Inpatient Hospital Stay (HOSPITAL_COMMUNITY)
Admission: AD | Admit: 2013-07-05 | Discharge: 2013-07-08 | DRG: 885 | Disposition: A | Discharge status: Home / Self Care | Payer: No Typology Code available for payment source | Source: Intra-hospital | Attending: Psychiatry | Admitting: Psychiatry

## 2013-07-05 DIAGNOSIS — F14120 Cocaine abuse with intoxication, uncomplicated: Secondary | ICD-10-CM

## 2013-07-05 DIAGNOSIS — Z23 Encounter for immunization: Secondary | ICD-10-CM

## 2013-07-05 DIAGNOSIS — F3130 Bipolar disorder, current episode depressed, mild or moderate severity, unspecified: Secondary | ICD-10-CM

## 2013-07-05 DIAGNOSIS — I509 Heart failure, unspecified: Secondary | ICD-10-CM | POA: Diagnosis present

## 2013-07-05 DIAGNOSIS — F10129 Alcohol abuse with intoxication, unspecified: Secondary | ICD-10-CM

## 2013-07-05 DIAGNOSIS — I2510 Atherosclerotic heart disease of native coronary artery without angina pectoris: Secondary | ICD-10-CM | POA: Diagnosis present

## 2013-07-05 DIAGNOSIS — F1020 Alcohol dependence, uncomplicated: Secondary | ICD-10-CM | POA: Diagnosis present

## 2013-07-05 DIAGNOSIS — M109 Gout, unspecified: Secondary | ICD-10-CM | POA: Diagnosis present

## 2013-07-05 DIAGNOSIS — F411 Generalized anxiety disorder: Secondary | ICD-10-CM | POA: Diagnosis present

## 2013-07-05 DIAGNOSIS — F329 Major depressive disorder, single episode, unspecified: Secondary | ICD-10-CM | POA: Diagnosis present

## 2013-07-05 DIAGNOSIS — F332 Major depressive disorder, recurrent severe without psychotic features: Principal | ICD-10-CM | POA: Diagnosis present

## 2013-07-05 DIAGNOSIS — R45851 Suicidal ideations: Secondary | ICD-10-CM

## 2013-07-05 DIAGNOSIS — I10 Essential (primary) hypertension: Secondary | ICD-10-CM | POA: Diagnosis present

## 2013-07-05 DIAGNOSIS — F1994 Other psychoactive substance use, unspecified with psychoactive substance-induced mood disorder: Secondary | ICD-10-CM | POA: Diagnosis present

## 2013-07-05 DIAGNOSIS — G4700 Insomnia, unspecified: Secondary | ICD-10-CM | POA: Diagnosis present

## 2013-07-05 DIAGNOSIS — F319 Bipolar disorder, unspecified: Secondary | ICD-10-CM | POA: Diagnosis present

## 2013-07-05 DIAGNOSIS — F1410 Cocaine abuse, uncomplicated: Secondary | ICD-10-CM | POA: Diagnosis present

## 2013-07-05 DIAGNOSIS — F17200 Nicotine dependence, unspecified, uncomplicated: Secondary | ICD-10-CM | POA: Diagnosis present

## 2013-07-05 LAB — COMPREHENSIVE METABOLIC PANEL
ALK PHOS: 101 U/L (ref 39–117)
ALT: 35 U/L (ref 0–53)
AST: 32 U/L (ref 0–37)
Albumin: 3.6 g/dL (ref 3.5–5.2)
BILIRUBIN TOTAL: 0.5 mg/dL (ref 0.3–1.2)
BUN: 23 mg/dL (ref 6–23)
CHLORIDE: 100 meq/L (ref 96–112)
CO2: 24 meq/L (ref 19–32)
Calcium: 9.1 mg/dL (ref 8.4–10.5)
Creatinine, Ser: 1.34 mg/dL (ref 0.50–1.35)
GFR calc non Af Amer: 64 mL/min — ABNORMAL LOW (ref >90)
GFR, EST AFRICAN AMERICAN: 74 mL/min — AB (ref >90)
GLUCOSE: 101 mg/dL — AB (ref 70–99)
POTASSIUM: 4 meq/L (ref 3.7–5.3)
SODIUM: 138 meq/L (ref 137–147)
Total Protein: 7.2 g/dL (ref 6.0–8.3)

## 2013-07-05 LAB — URINALYSIS, ROUTINE W REFLEX MICROSCOPIC
BILIRUBIN URINE: NEGATIVE
Glucose, UA: NEGATIVE mg/dL
Hgb urine dipstick: NEGATIVE
KETONES UR: NEGATIVE mg/dL
Leukocytes, UA: NEGATIVE
NITRITE: NEGATIVE
PH: 6 (ref 5.0–8.0)
Protein, ur: NEGATIVE mg/dL
Specific Gravity, Urine: 1.021 (ref 1.005–1.030)
Urobilinogen, UA: 0.2 mg/dL (ref 0.0–1.0)

## 2013-07-05 LAB — CBC
HCT: 46.9 % (ref 39.0–52.0)
HEMOGLOBIN: 15.7 g/dL (ref 13.0–17.0)
MCH: 30.7 pg (ref 26.0–34.0)
MCHC: 33.5 g/dL (ref 30.0–36.0)
MCV: 91.6 fL (ref 78.0–100.0)
Platelets: 268 10*3/uL (ref 150–400)
RBC: 5.12 MIL/uL (ref 4.22–5.81)
RDW: 13.8 % (ref 11.5–15.5)
WBC: 8.7 10*3/uL (ref 4.0–10.5)

## 2013-07-05 MED ORDER — METOPROLOL TARTRATE 25 MG PO TABS
25.0000 mg | ORAL_TABLET | Freq: Two times a day (BID) | ORAL | Status: DC
  Administered 2013-07-05 – 2013-07-08 (×6): 25 mg via ORAL
  Filled 2013-07-05: qty 28
  Filled 2013-07-05 (×6): qty 1
  Filled 2013-07-05: qty 28
  Filled 2013-07-05 (×5): qty 1

## 2013-07-05 MED ORDER — IBUPROFEN 600 MG PO TABS
600.0000 mg | ORAL_TABLET | Freq: Four times a day (QID) | ORAL | Status: DC | PRN
  Administered 2013-07-05 – 2013-07-08 (×4): 600 mg via ORAL
  Filled 2013-07-05 (×4): qty 1

## 2013-07-05 MED ORDER — DIVALPROEX SODIUM ER 500 MG PO TB24
500.0000 mg | ORAL_TABLET | Freq: Every day | ORAL | Status: DC
  Administered 2013-07-06 – 2013-07-07 (×2): 500 mg via ORAL
  Filled 2013-07-05 (×3): qty 1

## 2013-07-05 MED ORDER — FLUOXETINE HCL 20 MG PO CAPS
20.0000 mg | ORAL_CAPSULE | Freq: Every day | ORAL | Status: DC
  Administered 2013-07-06 – 2013-07-08 (×3): 20 mg via ORAL
  Filled 2013-07-05 (×3): qty 1
  Filled 2013-07-05: qty 14
  Filled 2013-07-05 (×2): qty 1

## 2013-07-05 MED ORDER — MAGNESIUM HYDROXIDE 400 MG/5ML PO SUSP: 30.0000 mL | Freq: Every day | ORAL | Status: DC | PRN

## 2013-07-05 MED ORDER — ACETAMINOPHEN 325 MG PO TABS
650.0000 mg | ORAL_TABLET | Freq: Four times a day (QID) | ORAL | Status: DC | PRN
  Administered 2013-07-06: 650 mg via ORAL
  Filled 2013-07-05: qty 2

## 2013-07-05 MED ORDER — ALUM & MAG HYDROXIDE-SIMETH 200-200-20 MG/5ML PO SUSP: 30.0000 mL | ORAL | Status: DC | PRN

## 2013-07-05 MED ORDER — TRAZODONE HCL 50 MG PO TABS
50.0000 mg | ORAL_TABLET | Freq: Every day | ORAL | Status: DC
  Administered 2013-07-05 – 2013-07-06 (×2): 50 mg via ORAL
  Filled 2013-07-05 (×4): qty 1

## 2013-07-05 NOTE — ED Notes (Signed)
[facility]

## 2013-07-05 NOTE — Consult Note (Signed)
[facility] Follow up Psychiatry Consult   Reason for Consult:  Depression, Gout pain Referring Physician:  EDP [name] is an 43 y.o. male.  Assessment: AXIS I:  Bipolar, Depressed, Depressive Disorder NOS and Mood Disorder NOS AXIS II:  Deferred AXIS III:   Past Medical History  Diagnosis Date  . Bipolar 1 disorder   . CHF (congestive heart failure)   . Coronary artery disease   . Hypertension    AXIS IV:  housing problems, other psychosocial or environmental problems, problems related to social environment and problems with primary support group AXIS V:  31-40 impairment in reality testing  Plan:  Recommend psychiatric Inpatient admission when medically cleared.  Subjective:   Andrew Chambers is a 43 y.o. male patient admitted with MAJOR DEPRESSIVE D/O, SEVERE, RECURRENT.Hx of Bipolar d/o  HPI:  Patient states that he continues to feel the same with depressive mood and gout pain.   Patient came in to the ER with c/o of gout pain and depressive mood. No changes in patient statement and condition  Patient states he is homeless from today and will not be going back to his mother.  Patient has family in [place] but has no contact with them.  Patient states he feel hopeless, helpless and angry that things are not working out well between him and his mother.  Patient reports poor appetite and sleep.  He reports using Cocaine occasionally and his last use was last night.  He reports drinking occasionally and his last drink was las night.  His alcohol level was 49 on arrival.  Patient denies HI/AVH.  Patient appeared angry and made minimal eye contact during the interview.  We have accepted patient for admission but will seek placement at other facilities since we are out of bed at this time.  We will restart his Depakote and add Prozac to his medications.  HPI Elements:   Location:  [facility]. Quality:  SEVERE.  Past Psychiatric History: Past Medical History  Diagnosis Date  . Bipolar 1  disorder   . CHF (congestive heart failure)   . Coronary artery disease   . Hypertension     reports that he drinks alcohol. His tobacco and drug histories are not on file. History reviewed. No pertinent family history. Family History Substance Abuse: No Family Supports: Yes, List: (a Friend of the family also lives in the home) Living Arrangements: Parent (Mother is sick) Can pt return to current living arrangement?: Yes Abuse/Neglect [facility]) Physical Abuse: Denies Verbal Abuse: Denies Sexual Abuse: Denies Allergies:  No Known Allergies  ACT Assessment Complete:  Yes:    Educational Status    Risk to Self: Risk to self Suicidal Ideation: Yes-Currently Present Suicidal Intent: Yes-Currently Present Is patient at risk for suicide?: Yes Suicidal Plan?: Yes-Currently Present Specify Current Suicidal Plan: shot self with his gun, walk in front of a car, or jump off a bridge Access to Means: Yes Specify Access to Suicidal Means: has a gun at home What has been your use of drugs/alcohol within the last 12 months?: use alcohol and cocaine used periodically Previous Attempts/Gestures: Yes How many times?: 3 Other Self Harm Risks: unknown Triggers for Past Attempts: Family contact (PT was guarded about stressors) Intentional Self Injurious Behavior: None Family Suicide History: Unknown Recent stressful life event(s): Other (Comment) (moved back home to care for Mother) Persecutory voices/beliefs?: Yes Depression: Yes Depression Symptoms: Isolating;Fatigue;Tearfulness;Feeling worthless/self pity (agitation) Substance abuse history and/or treatment for substance abuse?: Yes (UDS positive for cocaine  BAL 49) Suicide prevention information given to non-admitted patients: Not applicable  Risk to Others: Risk to Others Homicidal Ideation: Yes-Currently Present Thoughts of Harm to Others: Yes-Currently Present Comment - Thoughts of Harm to Others: "I will either hurt myself or someone  elase if I go home." Current Homicidal Intent: No Current Homicidal Plan: No (PT does have access to gun.) Access to Homicidal Means: Yes Describe Access to Homicidal Means: has gun at home Identified Victim: No History of harm to others?: No (PT reports having anger issues, unsure of history) Assessment of Violence: None Noted Violent Behavior Description: cfurrently cooperative with TTS Does patient have access to weapons?: Yes (Comment) Criminal Charges Pending?: No Does patient have a court date: No  Abuse: Abuse/Neglect Assessment (Assessment to be complete while patient is alone) Physical Abuse: Denies Verbal Abuse: Denies Sexual Abuse: Denies Exploitation of patient/patient's resources: Denies Self-Neglect: Denies  Prior Inpatient Therapy: Prior Inpatient Therapy Prior Inpatient Therapy: Yes Prior Therapy Dates: 2014 Prior Therapy Facilty/Provider(s): [facility] Reason for Treatment: suicidal ideations  Prior Outpatient Therapy: Prior Outpatient Therapy Prior Outpatient Therapy:  (Unknown) Prior Therapy Dates:  (Unknown) Prior Therapy Facilty/Provider(s):  (Unknown) Reason for Treatment: medication management(?)  Additional Information: Additional Information 1:1 In Past 12 Months?: No CIRT Risk: No Elopement Risk: No Does patient have medical clearance?: Yes                  Objective: Blood pressure 152/90, pulse 84, temperature 97.7 F (36.5 C), temperature source Oral, resp. rate 20, SpO2 97.00%.There is no height or weight on file to calculate BMI. Results for orders placed during the hospital encounter of 07/04/13 (from the past 72 hour(s))  URINE RAPID DRUG SCREEN (HOSP PERFORMED)     Status: Abnormal   Collection Time    07/04/13  9:26 AM      Result Value Range   Opiates NONE DETECTED  NONE DETECTED   Cocaine POSITIVE (*) NONE DETECTED   Benzodiazepines NONE DETECTED  NONE DETECTED   Amphetamines NONE DETECTED  NONE DETECTED    Tetrahydrocannabinol NONE DETECTED  NONE DETECTED   Barbiturates NONE DETECTED  NONE DETECTED   Comment:            DRUG SCREEN FOR MEDICAL PURPOSES     ONLY.  IF CONFIRMATION IS NEEDED     FOR ANY PURPOSE, NOTIFY LAB     WITHIN 5 DAYS.                LOWEST DETECTABLE LIMITS     FOR URINE DRUG SCREEN     Drug Class       Cutoff (ng/mL)     Amphetamine      1000     Barbiturate      200     Benzodiazepine   200     Tricyclics       300     Opiates          300     Cocaine          300     THC              50  ETHANOL     Status: Abnormal   Collection Time    07/04/13  9:40 AM      Result Value Range   Alcohol, Ethyl (B) 49 (*) 0 - 11 mg/dL   Comment:            LOWEST DETECTABLE LIMIT  FOR     SERUM ALCOHOL IS 11 mg/dL     FOR MEDICAL PURPOSES ONLY  CBC     Status: None   Collection Time    07/05/13  2:41 PM      Result Value Range   WBC 8.7  4.0 - 10.5 K/uL   RBC 5.12  4.22 - 5.81 MIL/uL   Hemoglobin 15.7  13.0 - 17.0 g/dL   HCT 46.9  39.0 - 52.0 %   MCV 91.6  78.0 - 100.0 fL   MCH 30.7  26.0 - 34.0 pg   MCHC 33.5  30.0 - 36.0 g/dL   RDW 13.8  11.5 - 15.5 %   Platelets 268  150 - 400 K/uL  COMPREHENSIVE METABOLIC PANEL     Status: Abnormal   Collection Time    07/05/13  2:41 PM      Result Value Range   Sodium 138  137 - 147 mEq/L   Potassium 4.0  3.7 - 5.3 mEq/L   Chloride 100  96 - 112 mEq/L   CO2 24  19 - 32 mEq/L   Glucose, Bld 101 (*) 70 - 99 mg/dL   BUN 23  6 - 23 mg/dL   Creatinine, Ser 1.34  0.50 - 1.35 mg/dL   Calcium 9.1  8.4 - 10.5 mg/dL   Total Protein 7.2  6.0 - 8.3 g/dL   Albumin 3.6  3.5 - 5.2 g/dL   AST 32  0 - 37 U/L   ALT 35  0 - 53 U/L   Alkaline Phosphatase 101  39 - 117 U/L   Total Bilirubin 0.5  0.3 - 1.2 mg/dL   GFR calc non Af Amer 64 (*) >90 mL/min   GFR calc Af Amer 74 (*) >90 mL/min   Comment: (NOTE)     The eGFR has been calculated using the CKD EPI equation.     This calculation has not been validated in all clinical  situations.     eGFR's persistently <90 mL/min signify possible Chronic Kidney     Disease.  URINALYSIS, ROUTINE W REFLEX MICROSCOPIC     Status: None   Collection Time    07/05/13  2:44 PM      Result Value Range   Color, Urine YELLOW  YELLOW   APPearance CLEAR  CLEAR   Specific Gravity, Urine 1.021  1.005 - 1.030   pH 6.0  5.0 - 8.0   Glucose, UA NEGATIVE  NEGATIVE mg/dL   Hgb urine dipstick NEGATIVE  NEGATIVE   Bilirubin Urine NEGATIVE  NEGATIVE   Ketones, ur NEGATIVE  NEGATIVE mg/dL   Protein, ur NEGATIVE  NEGATIVE mg/dL   Urobilinogen, UA 0.2  0.0 - 1.0 mg/dL   Nitrite NEGATIVE  NEGATIVE   Leukocytes, UA NEGATIVE  NEGATIVE   Comment: MICROSCOPIC NOT DONE ON URINES WITH NEGATIVE PROTEIN, BLOOD, LEUKOCYTES, NITRITE, OR GLUCOSE <1000 mg/dL.   Labs are reviewed and are pertinent for UDS is positive for alcohol and his alcohol level is 49.  Current Facility-Administered Medications  Medication Dose Route Frequency Provider Last Rate Last Dose  . divalproex (DEPAKOTE ER) 24 hr tablet 500 mg  500 mg Oral Daily Delfin Gant, NP   500 mg at 07/05/13 1008  . FLUoxetine (PROZAC) capsule 20 mg  20 mg Oral Daily Delfin Gant, NP   20 mg at 07/05/13 1008  . ibuprofen (ADVIL,MOTRIN) tablet 600 mg  600 mg Oral Q6H PRN Dara Hoyer, PA-C  600 mg at 07/05/13 1011  . metoprolol tartrate (LOPRESSOR) tablet 25 mg  25 mg Oral BID Tanna Furry, MD   25 mg at 07/05/13 1008  . traZODone (DESYREL) tablet 50 mg  50 mg Oral QHS Dara Hoyer, PA-C   50 mg at 07/04/13 2100   Current Outpatient Prescriptions  Medication Sig Dispense Refill  . colchicine 0.6 MG tablet Take 1 tablet (0.6 mg total) by mouth 2 (two) times daily as needed (for gout pain).  20 tablet  0  . indomethacin (INDOCIN) 25 MG capsule Take 1 capsule (25 mg total) by mouth 3 (three) times daily as needed.  30 capsule  0    Psychiatric Specialty Exam:     Blood pressure 152/90, pulse 84, temperature 97.7 F (36.5 C),  temperature source Oral, resp. rate 20, SpO2 97.00%.There is no height or weight on file to calculate BMI.  General Appearance: Casual and Fairly Groomed  Engineer, water::  poor  Speech:  Clear and Coherent and Normal Rate  Volume:  Normal  Mood:  Angry, Depressed, Hopeless, Irritable and Worthless  Affect:  Congruent, Depressed and Flat  Thought Process:  Coherent and Goal Directed  Orientation:  Full (Time, Place, and Person)  Thought Content:  NA  Suicidal Thoughts:  No  Homicidal Thoughts:  No  Memory:  Immediate;   Good Recent;   Good Remote;   Good  Judgement:  Poor  Insight:  Fair  Psychomotor Activity:  Normal  Concentration:  Good  Recall:  NA  Akathisia:  NA  Handed:  Right  AIMS (if indicated):     Assets:  Desire for Improvement Housing  Sleep:      Treatment Plan Summary:  Consult and face to face interview with Dr Jearld Lesch We will restart his antidepressants and his Depakote We will make referral to other facilities for available bed.  Daily contact with patient to assess and evaluate symptoms and progress in treatment Medication management  Earleen Newport   FNP-BC 07/05/2013 6:51 PM   Patient was seen face to face for this evaluation and case discussed with physician extender. Reviewed the information documented by physician extender and agree with the treatment plan.  Rateel Beldin,JANARDHAHA R. 07/06/2013 5:35 PM

## 2013-07-05 NOTE — ED Notes (Signed)
Adult Psychoeducational Group Note  Date:  07/05/2013 Time:  12:52 PM  Group Topic/Focus:  Overcoming Stress:   The focus of this group is to define stress and help patients assess their triggers.  Participation Level:  Active  Participation Quality:  Appropriate  Affect:  Appropriate  Cognitive:  Appropriate  Insight: Appropriate  Engagement in Group:  Engaged  Modes of Intervention:  Discussion  Additional Comments:  Andrew Chambers was very open to the group topic. He was very engaged and shared his story with other patient.   [name], [name] 07/05/2013, 12:52 PM

## 2013-07-05 NOTE — ED Notes (Signed)
[facility] [employer] and [facility]. No s/s of distress noted at this time.

## 2013-07-05 NOTE — Progress Notes (Signed)
CSW received consult from RN to meet with pt, at pt's request.  Pt stated that he wanted social work to place him into a group home. 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 Pt stated that when he lived in [place] he had lived in a large, apartment style group home with 20+ other residents, and this GH helped manage medications and gave him a safe place to live. He states would like to be placed in a similar facility now.  CSW provided education about group home structure in [place] and informed pt that he was likely not [facility] due to his level of functioning. Pt declined homelessness resources at this time, stated that since he was being admitted to inpatient, he would think about "that later on down the line."  York Spaniellexandra Keimari  KalapanaLCSWA, [phone]     ED CSW

## 2013-07-05 NOTE — BH Assessment (Signed)
[facility] Jon[facility]agadda. Assigned bed #508-2.

## 2013-07-06 ENCOUNTER — Encounter (HOSPITAL_COMMUNITY): Payer: Self-pay | Admitting: *Deleted

## 2013-07-06 DIAGNOSIS — F329 Major depressive disorder, single episode, unspecified: Secondary | ICD-10-CM

## 2013-07-06 DIAGNOSIS — R45851 Suicidal ideations: Secondary | ICD-10-CM

## 2013-07-06 MED ORDER — COLCHICINE 0.6 MG PO TABS
0.6000 mg | ORAL_TABLET | Freq: Every day | ORAL | Status: DC
  Administered 2013-07-07 – 2013-07-08 (×2): 0.6 mg via ORAL
  Filled 2013-07-06 (×3): qty 1
  Filled 2013-07-06: qty 14
  Filled 2013-07-06: qty 1

## 2013-07-06 MED ORDER — TRAZODONE HCL 50 MG PO TABS
50.0000 mg | ORAL_TABLET | Freq: Every evening | ORAL | Status: DC | PRN
  Administered 2013-07-07: 50 mg via ORAL
  Filled 2013-07-06: qty 1
  Filled 2013-07-06: qty 28
  Filled 2013-07-06: qty 1
  Filled 2013-07-06: qty 28
  Filled 2013-07-06 (×5): qty 1

## 2013-07-06 MED ORDER — COLCHICINE 0.6 MG PO TABS
1.2000 mg | ORAL_TABLET | Freq: Once | ORAL | Status: AC
  Administered 2013-07-06: 1.2 mg via ORAL
  Filled 2013-07-06 (×2): qty 2

## 2013-07-06 MED ORDER — PNEUMOCOCCAL VAC POLYVALENT 25 MCG/0.5ML IJ INJ
0.5000 mL | INJECTION | INTRAMUSCULAR | Status: AC
  Administered 2013-07-07: 0.5 mL via INTRAMUSCULAR

## 2013-07-06 MED ORDER — INFLUENZA VAC SPLIT QUAD 0.5 ML IM SUSP
0.5000 mL | INTRAMUSCULAR | Status: AC
  Administered 2013-07-07: 0.5 mL via INTRAMUSCULAR
  Filled 2013-07-06: qty 0.5

## 2013-07-06 NOTE — Progress Notes (Signed)
Patient ID: Andrew SchlichterMark Chambers, male   DOB: Jun 07, 1971, 43 y.o.   MRN: [id number] [facility] is [facility] for this 42yo DWM who [facility], was voluntary.  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  [place] help take care of his mother, bought a house and moved her in with him but can't deal with her anymore.  States is ready to leave the house to her and move into an [address] where he has no more stress about caring for her, family stress, etc.  States father and ex are in [place], his mother is here, tired of all the travel, has 1 son, 3320.  States is going to be homeless, [facility], admits to 3 prior attempts.  Story has inconsistencies, guarded, but stated his brother is in prison for life, feels he has lost him, and grandmother died in Dec., was very close to her. Admits to sometimes a daily drink, but states frequently can't finish it.  UDS pos for cocaine, BAL was 49 in ED.  Also admits to SI, no plan, but has firearms at home. Affect is superficially bright, guarded, eye contact varies.  States has been off meds for sometime.   Med hx includes HTN, CHF, gout, CAD.  No surgical hx.  NKDA.   Was oriented to unit, rules, offered nourishments.  Was cooperative with admission process but began closing eyes and said was too tired to answer any more questions.

## 2013-07-06 NOTE — Progress Notes (Signed)
Recreation Therapy Notes  Date: 01.19.2015 Time: 3:00pm Location: [address] Dayroom   Group Topic: Coping Skills  Goal Area(s) Addresses:  Patient will identify benefit of using coping skill.  Patient will identify ability to positively change life by using coping skills.   Behavioral Response: Refused to participate  Intervention: Journaling  Activity: 40-20-10-5. Patients were asked to identify their reason for admission, using this as inspiration patients were asked to write about it in 40, 20, 10, 5, and 1 word(s).    Education: PharmacologistCoping Skills, Discharge Planning   Education Outcome: Acknowledges understanding   Clinical Observations/Feedback: Patient attended group session, but refused participation in group activity. Patient remained in group session, but made no statements or contributions to group discussion.   Marykay Lexenise L Mordche Hedglin, LRT/CTRS  Jearl KlinefelterBlanchfield, Ayuub Penley L 07/06/2013 4:42 PM

## 2013-07-06 NOTE — B[facility] Group Notes (Signed)
[facility] LCSW Group Therapy  07/06/2013  1:15 PM   Type of Therapy:  Group Therapy  Participation Level:  Active  Participation Quality:  Attentive, Sharing and Supportive  Affect:  Depressed and Flat  Cognitive:  Alert and Oriented  Insight:  Developing/Improving and Engaged  Engagement in Therapy:  Developing/Improving and Engaged  Modes of Intervention:  Clarification, Confrontation, Discussion, Education, Exploration, Limit-setting, Orientation, Problem-solving, Rapport Building, Dance movement psychotherapisteality Testing, Socialization and Support  Summary of Progress/Problems: Pt identified obstacles faced currently and processed barriers involved in overcoming these obstacles. Pt identified steps necessary for overcoming these obstacles and explored motivation (internal and external) for facing these difficulties head on. Pt further identified one area of concern in their lives and chose a goal to focus on for today.  Pt shared that his biggest obstacle is his living situation.  Pt actively listened to group discussion.    Andrew IvanChelsea Horton, LCSW 07/06/2013 2:56 PM

## 2013-07-06 NOTE — [facility] Group Notes (Signed)
[facility] L[facility] Dis[facility] Note   07/06/2013 8:45 AM  Participation Quality:  Alert, Appropriate and Oriented  Mood/Affect:  Anxious  Depression Rating:  0  Anxiety Rating:  8  Thoughts of Suicide:  Pt denies SI/HI  Will you contract for safety?   Yes  Current AVH:  Pt denies  Plan for Discharge/Comments:  Pt attended discharge planning group and actively participated in group.  CSW provided pt with today's workbook.  Pt states that he [facility] upon discharge for safety and relapse prevention but [place] a home in [place].  CSW will assess for appropriate referrals.  No further needs voiced by pt at this time.    Transportation Means: Pt reports access to transportation  Supports: No supports mentioned at this time  Reyes IvanChelsea Horton, LCSW 07/06/2013 10:04 AM

## 2013-07-06 NOTE — Progress Notes (Signed)
Adult Psychoeducational Group Note  Date:  07/06/2013 Time:  10:32 PM  Group Topic/Focus:  Wrap-Up Group:   The focus of this group is to help patients review their daily goal of treatment and discuss progress on daily workbooks.  Participation Level:  Minimal  Participation Quality:  Resistant  Affect:  Flat, Irritable and Resistant  Cognitive:  Appropriate  Insight: Appropriate  Engagement in Group:  Lacking, Limited and Poor  Modes of Intervention:  Support  Additional Comments:  Pt stated that he couldn't say anything nice and that he chose to "pass" Pt seemed irritable and affect was flat.   Courtez Twaddle 07/06/2013, 10:32 PM

## 2013-07-06 NOTE — BHH Counselor (Signed)
Adult Comprehensive Assessment  Patient ID: Andrew Chambers, male   DOB: 1970-11-27, 43 y.o.   MRN: [id number]  Information Source: Information source: Patient  Current Stressors:  Educational / Learning stressors: N/A Employment / Job issues: N/A Family Relationships: Strained relationship with mother currently Surveyor, quantity / Lack of resources (include bankruptcy): N/A Housing / Lack of housing: mother drinks in the home, doesn't want to go back and is homeless now Physical health (include injuries & life threatening diseases): N/A Social relationships: Lack of support Substance abuse: Cocaine and alcohol abuse Bereavement / Loss: N/A  Living/Environment/Situation:  Living Arrangements: Alone Living conditions (as described by patient or guardian): Pt lives with mother in [place] and reports not planning on returning to it being a poor environment due to her drinking.   How long has patient lived in current situation?: since October What is atmosphere in current home: Temporary  Family History:  Marital status: Divorced Divorced, when?: 2001 What types of issues is patient dealing with in the relationship?: states wife wouldn't work or do anything Additional relationship information: N/A Does patient have children?: Yes How many children?: 1 How is patient's relationship with their children?: Adult son, pt states that he has a good relationship with him.    Childhood History:  By whom was/is the patient raised?: Both parents Additional childhood history information: Pt states that parents were seperated but they coparented well and he was spoiled during his childhood.  Description of patient's relationship with caregiver when they were a child: Pt reports getting along well with parents growing up.   Patient's description of current relationship with people who raised him/her: Pt reports being close to parents now.   Does patient have siblings?: No Did patient suffer any  verbal/emotional/physical/sexual abuse as a child?: No Did patient suffer from severe childhood neglect?: No Has patient ever been sexually abused/assaulted/raped as an adolescent or adult?: No Was the patient ever a victim of a crime or a disaster?: No Witnessed domestic violence?: No Has patient been effected by domestic violence as an adult?: No  Education:  Highest grade of school patient has completed: Programme researcher, broadcasting/film/video in Nursing Currently a student?: No Learning disability?: No  Employment/Work Situation:   Employment situation: Employed 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 How long has patient been employed?: since October Patient's job has been impacted by current illness: No What is the longest time patient has a held a job?: 21 years 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 Has patient ever been in the [employer]?: No Has patient ever served in Buyer, retail?: No  Financial Resources:   Surveyor, quantity resources: Income from [employer] insurance Does patient have a representative payee or guardian?: No  Alcohol/Substance Abuse:   What has been your use of drugs/alcohol within the last 12 months?: Cocaine - $900, reports using this amount one time after not using for months, daily alcohol use If attempted suicide, did drugs/alcohol play a role in this?: No Alcohol/Substance Abuse Treatment Hx: Past Tx, Inpatient If yes, describe treatment: inpatient treatment in [place] - 120 days, 30 day treatments - multiple hospitalizations/treatment but can't name dates. Has alcohol/substance abuse ever caused legal problems?: No  Social Support System:   Patient's Community Support System: Poor Describe Community Support System: Pt reports having a strained relationship with mother right now.   Type of faith/religion: Ephriam Knuckles How does patient's faith help to cope with current illness?: prayer, read the bible  Leisure/Recreation:   Leisure and Hobbies:  flying  planes, playing piano  Strengths/Needs:   What things does the patient do well?: playing piano In what areas does patient struggle / problems for patient: Depression, anxiety, SI, substance abuse  Discharge Plan:   Does patient have access to transportation?: Yes Will patient be returning to same living situation after discharge?: Yes Currently receiving community mental health services: No If no, would patient like referral for services when discharged?: Yes (What county?) [facility] Does patient have financial barriers related to discharge medications?: No  Summary/Recommendations:     Patient is a 43 year old Caucasian Male with a diagnosis of Alcohol Use Disorder, Cocaine Use Disorder and Mood Disorder NOS.  Patient lives in [place] with his mother, but reports planning on not returning.  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  Pt states that he relapsed prior to admission and is interested in [facility].  Patient will benefit from crisis stabilization, medication evaluation, group therapy and psycho education in addition to case management for discharge planning.    Horton, Salome Arnthelsea Nicole. 07/06/2013

## 2013-07-06 NOTE — H&P (Signed)
Psychiatric Admission Assessment Adult  Patient Identification:  Andrew Chambers Date of Evaluation:  07/06/2013 Chief Complaint:  bipolar, depressed disorder NOS Mood disorder NOS History of Present Illness::  42yo caucasian male presenting to the [facility] ED, was voluntary. 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 States moved here from [facility] to help take care of his mother, bought a house and moved her in with him but can't deal with her anymore. States is ready to leave the house to her and move into an [address] where he has no more stress about caring for her, family stress, etc. States father and ex are in [place], his mother is here, tired of all the travel, has 1 son, 68. States is going to be homeless, admits to [facility], admits to 3 prior attempts. Story has inconsistencies, guarded, but stated his brother is in prison for life, feels he has lost him, and grandmother died in June 25, 2023., was very close to her. Admits to drinking daily, but intermittently (a drink), butt states that he frequently can't finish it. UDS pos for cocaine, BAL was 49 in ED. Also admits to SI, no plan, but has firearms at home. Affect is superficially bright, guarded, eye contact varies. States has been off meds for sometime.  Med hx includes HTN, CHF, gout, CAD. No surgical hx. NKDA. We will restart his Depakote and add Prozac to his medications.   During admission assessment, pt denies current thoughts of SI, HI, and AVH. Pt continues to affirm that he was "a nurse for 23 yrs, but burned out," but no RN or LPN licensure records support his statement. 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 Pt continues to complain about his "gout pain" which was verified as a current dx (ED notes). Pt continues to express interest in going to [facility] upon discharge. Rates anxiety at 7/10 and depression at 6/10. Continues to report good appetite, but poor sleep. He states that he is just "sick and tired of doing the same thing, just sick and tired of being sick and tired and here to make things better".     Elements:  Location:  Generalized, inpatient. Quality:  Worsening. Severity:  Severe. Timing:  Constant. Duration:  Chronic. Associated Signs/Synptoms: Depression Symptoms:  depressed mood, anhedonia, insomnia, anxiety, (Hypo) Manic Symptoms:  N/A Anxiety Symptoms:  Excessive Worry, Psychotic Symptoms:  N/A PTSD Symptoms: NA  Psychiatric Specialty Exam: Physical Exam  Full Physical Exam performed in ED; reviewed, stable, and I concur with this assessment.   Review of Systems  Constitutional: Negative.   HENT: Negative.   Eyes: Negative.   Respiratory: Negative.   Cardiovascular: Negative.   Gastrointestinal: Negative.   Genitourinary: Negative.   Musculoskeletal: Negative.   Skin: Negative.   Neurological: Negative.   Endo/Heme/Allergies: Negative.   Psychiatric/Behavioral: Positive for depression. Negative for suicidal ideas, hallucinations, memory loss and substance abuse. The patient is nervous/anxious and has insomnia.  Blood pressure 134/89, pulse 82, temperature 97.9 F (36.6 C), temperature source Oral, resp. rate 20, height $RemoveBe'5\' 10"'IiJytaxzM$  (1.778 m), weight 102.059 kg (225 lb).Body mass index is 32.28 kg/(m^2).  General Appearance: Casual  Eye Contact::  Good  Speech:  Clear and Coherent  Volume:  Normal  Mood:  Depressed  Affect:  Appropriate  Thought Process:  Coherent  Orientation:  Full (Time,  Place, and Person)  Thought Content:  WDL  Suicidal Thoughts:  No  Homicidal Thoughts:  No  Memory:  Immediate;   Good Recent;   Good Remote;   Good  Judgement:  Fair  Insight:  Fair  Psychomotor Activity:  Normal  Concentration:  Fair  Recall:  Good  Akathisia:  No  Handed:  Right  AIMS (if indicated):     Assets:  Resilience  Sleep:   Poor    Past Psychiatric History: Diagnosis: Major Depression, recurrent, severe. Suicidal Ideation.  Hospitalizations: [facility] for SI  Outpatient Care: Denies  Substance Abuse Care: Denies  Self-Mutilation:Denies  Suicidal Attempts: Hx of OD in [place]  Violent Behaviors: Denies   Past Medical History:   Past Medical History  Diagnosis Date  . Bipolar 1 disorder   . CHF (congestive heart failure)   . Coronary artery disease   . Hypertension    None. Allergies:  No Known Allergies PTA Medications: No prescriptions prior to admission    Previous Psychotropic Medications:  Medication/Dose  SEE MAR               Substance Abuse History in the last 12 months:  yes  Consequences of Substance Abuse: Family Consequences:  hospitalization and threats to family dynamics  Social History:  reports that he has been smoking.  He does not have any smokeless tobacco history on file. He reports that he drinks about 3.5 ounces of alcohol per week. He reports that he uses illicit drugs (Cocaine) about once per week. Additional Social History:                      Current Place of Residence:  [place], [place] of Birth:  [place] Family Members: Mother Marital Status:  Single Children:  Sons: 91yo  Daughters: Relationships:Single Education:  [employer] Educational Problems/Performance: N/A Religious Beliefs/Practices: Christian History of Abuse (Emotional/Phsycial/Sexual) Denies Occupational Experiences; "Environmental manager History:  Denies Legal History: Denies Hobbies/Interests: Educational psychologist, piano  Family History:  History  reviewed. No pertinent family history.  Results for orders placed during the hospital encounter of 07/04/13 (from the past 72 hour(s))  URINE RAPID DRUG SCREEN (HOSP PERFORMED)     Status: Abnormal   Collection Time    07/04/13  9:26 AM      Result Value Range   Opiates NONE DETECTED  NONE DETECTED   Cocaine POSITIVE (*) NONE DETECTED   Benzodiazepines NONE DETECTED  NONE DETECTED   Amphetamines NONE DETECTED  NONE DETECTED   Tetrahydrocannabinol NONE DETECTED  NONE DETECTED   Barbiturates NONE DETECTED  NONE DETECTED   Comment:            DRUG SCREEN FOR MEDICAL PURPOSES     ONLY.  IF CONFIRMATION IS NEEDED     FOR ANY PURPOSE, NOTIFY LAB     WITHIN 5 DAYS.                LOWEST DETECTABLE LIMITS     FOR URINE DRUG SCREEN     Drug Class  Cutoff (ng/mL)     Amphetamine      1000     Barbiturate      200     Benzodiazepine   662     Tricyclics       947     Opiates          300     Cocaine          300     THC              50  ETHANOL     Status: Abnormal   Collection Time    07/04/13  9:40 AM      Result Value Range   Alcohol, Ethyl (B) 49 (*) 0 - 11 mg/dL   Comment:            LOWEST DETECTABLE LIMIT FOR     SERUM ALCOHOL IS 11 mg/dL     FOR MEDICAL PURPOSES ONLY  CBC     Status: None   Collection Time    07/05/13  2:41 PM      Result Value Range   WBC 8.7  4.0 - 10.5 K/uL   RBC 5.12  4.22 - 5.81 MIL/uL   Hemoglobin 15.7  13.0 - 17.0 g/dL   HCT 46.9  39.0 - 52.0 %   MCV 91.6  78.0 - 100.0 fL   MCH 30.7  26.0 - 34.0 pg   MCHC 33.5  30.0 - 36.0 g/dL   RDW 13.8  11.5 - 15.5 %   Platelets 268  150 - 400 K/uL  COMPREHENSIVE METABOLIC PANEL     Status: Abnormal   Collection Time    07/05/13  2:41 PM      Result Value Range   Sodium 138  137 - 147 mEq/L   Potassium 4.0  3.7 - 5.3 mEq/L   Chloride 100  96 - 112 mEq/L   CO2 24  19 - 32 mEq/L   Glucose, Bld 101 (*) 70 - 99 mg/dL   BUN 23  6 - 23 mg/dL   Creatinine, Ser 1.34  0.50 - 1.35 mg/dL   Calcium 9.1   8.4 - 10.5 mg/dL   Total Protein 7.2  6.0 - 8.3 g/dL   Albumin 3.6  3.5 - 5.2 g/dL   AST 32  0 - 37 U/L   ALT 35  0 - 53 U/L   Alkaline Phosphatase 101  39 - 117 U/L   Total Bilirubin 0.5  0.3 - 1.2 mg/dL   GFR calc non Af Amer 64 (*) >90 mL/min   GFR calc Af Amer 74 (*) >90 mL/min   Comment: (NOTE)     The eGFR has been calculated using the CKD EPI equation.     This calculation has not been validated in all clinical situations.     eGFR's persistently <90 mL/min signify possible Chronic Kidney     Disease.  URINALYSIS, ROUTINE W REFLEX MICROSCOPIC     Status: None   Collection Time    07/05/13  2:44 PM      Result Value Range   Color, Urine YELLOW  YELLOW   APPearance CLEAR  CLEAR   Specific Gravity, Urine 1.021  1.005 - 1.030   pH 6.0  5.0 - 8.0   Glucose, UA NEGATIVE  NEGATIVE mg/dL   Hgb urine dipstick NEGATIVE  NEGATIVE   Bilirubin Urine NEGATIVE  NEGATIVE   Ketones, ur NEGATIVE  NEGATIVE mg/dL  Protein, ur NEGATIVE  NEGATIVE mg/dL   Urobilinogen, UA 0.2  0.0 - 1.0 mg/dL   Nitrite NEGATIVE  NEGATIVE   Leukocytes, UA NEGATIVE  NEGATIVE   Comment: MICROSCOPIC NOT DONE ON URINES WITH NEGATIVE PROTEIN, BLOOD, LEUKOCYTES, NITRITE, OR GLUCOSE <1000 mg/dL.   Psychological Evaluations:  Assessment:   DSM5:  Substance/Addictive Disorders:  Cocaine abuse Depressive Disorders:  Major Depressive Disorder - Severe (296.23)  AXIS I:  Major Depression, Recurrent severe and Substance Abuse AXIS II:  Deferred AXIS III:   Past Medical History  Diagnosis Date  . Bipolar 1 disorder   . CHF (congestive heart failure)   . Coronary artery disease   . Hypertension    AXIS IV:  other psychosocial or environmental problems and problems related to social environment AXIS V:  41-50 serious symptoms  Treatment Plan/Recommendations:   Review of chart, vital signs, medications, and notes.  1-Individual and group therapy  2-Medication management for depression and anxiety: Medications  reviewed with the patient and he stated no untoward effects. However, Trazodone increased to $RemoveBefo'100mg'cDTmfFfyFVT$  qhs for insomnia.  3-Coping skills for depression, anxiety  4-Continue crisis stabilization and management  5-Address health issues--monitoring vital signs, stable  6-Treatment plan in progress to prevent relapse of depression and anxiety  Treatment Plan Summary: Daily contact with patient to assess and evaluate symptoms and progress in treatment Medication management Current Medications:  Current Facility-Administered Medications  Medication Dose Route Frequency Provider Last Rate Last Dose  . acetaminophen (TYLENOL) tablet 650 mg  650 mg Oral Q6H PRN Shuvon Rankin, NP      . alum & mag hydroxide-simeth (MAALOX/MYLANTA) 200-200-20 MG/5ML suspension 30 mL  30 mL Oral Q4H PRN Shuvon Rankin, NP      . divalproex (DEPAKOTE ER) 24 hr tablet 500 mg  500 mg Oral Daily Shuvon Rankin, NP      . FLUoxetine (PROZAC) capsule 20 mg  20 mg Oral Daily Shuvon Rankin, NP      . ibuprofen (ADVIL,MOTRIN) tablet 600 mg  600 mg Oral Q6H PRN Shuvon Rankin, NP   600 mg at 07/06/13 0648  . magnesium hydroxide (MILK OF MAGNESIA) suspension 30 mL  30 mL Oral Daily PRN Shuvon Rankin, NP      . metoprolol tartrate (LOPRESSOR) tablet 25 mg  25 mg Oral BID Shuvon Rankin, NP   25 mg at 07/05/13 2230  . traZODone (DESYREL) tablet 50 mg  50 mg Oral QHS Shuvon Rankin, NP   50 mg at 07/05/13 2230     Observation Level/Precautions:  15 minute checks  Laboratory:  Labs resulted, reviewed, and stable at this time.   Psychotherapy:  Group therapy, individual therapy, psychoeducation  Medications:  See MAR above  Consultations: None    Discharge Concerns: None    Estimated LOS: 5-7 days  Other:  N/A   I certify that inpatient services furnished can reasonably be expected to improve the patient's condition.   Benjamine Mola, [place] 1/19/20157:34 AM  Patient was seen face-to-face for this psychiatric evaluation, admission  suicide risk assessment and case discussed with a physician extender, formulated treatment plan.Reviewed the information documented and agree with the treatment plan.  Saniya Tranchina,JANARDHAHA R. 07/07/2013 7:41 PM

## 2013-07-06 NOTE — Progress Notes (Signed)
D:  Patient's self inventory sheet, patient has poor sleep, improving appetite, low energy level, good attention span.  Rated depression 8, hopeless 5.  Denied withdrawals.  SI off/on, contracts for safety.  Has had left  foot/ankle pain, has gout.  May go to [facility] after discharge, plans to stay clean and stay on meds.  No discharge plans.  Will have problems taking meds after discharge. A:  Medications administered per MD orders.  Encouragement and support given patient today. R:  Denied HI.  Denied A/V hallucinations.  Denied SI while talking to nurse.  SI off/on, put on self inventory form, contracts for safety.  Will continue to monitor patient for safety with 15 minute checks.  Safety maintained.

## 2013-07-06 NOTE — [facility] Suicide Risk Assessment (Signed)
Suicide Risk Assessment  Admission Assessment     Nursing information obtained from:  Patient Demographic factors:  Male;Divorced or widowed;Caucasian;Access to firearms Current Mental Status:  Self-harm thoughts Loss Factors:  NA Historical Factors:  Prior suicide attempts;Anniversary of important loss Risk Reduction Factors:  Sense of responsibility to family;Employed;Positive coping skills or problem solving skills  CLINICAL FACTORS:   Depression:   Anhedonia Comorbid alcohol abuse/dependence Hopelessness Impulsivity Insomnia Recent sense of peace/wellbeing Severe Alcohol/Substance Abuse/Dependencies Unstable or Poor Therapeutic Relationship Previous Psychiatric Diagnoses and Treatments Medical Diagnoses and Treatments/Surgeries  COGNITIVE FEATURES THAT CONTRIBUTE TO RISK:  Closed-mindedness Loss of executive function Polarized thinking    SUICIDE RISK:   Moderate:  Frequent suicidal ideation with limited intensity, and duration, some specificity in terms of plans, no associated intent, good self-control, limited dysphoria/symptomatology, some risk factors present, and identifiable protective factors, including available and accessible social support.  PLAN OF CARE: Admit for crisis stabilization, safety monitoring and medication management for depression and suicidal ideation, he also has substance abuse but minimizes at this time.   I certify that inpatient services furnished can reasonably be expected to improve the patient's condition.  Andrew Chambers,JANARDHAHA R. 07/06/2013, 5:48 PM

## 2013-07-07 MED ORDER — DIVALPROEX SODIUM ER 250 MG PO TB24
750.0000 mg | ORAL_TABLET | Freq: Every day | ORAL | Status: DC
  Administered 2013-07-08: 750 mg via ORAL
  Filled 2013-07-07: qty 42
  Filled 2013-07-07 (×3): qty 3

## 2013-07-07 NOTE — Progress Notes (Signed)
Pt attended spiritual care group on grief and loss facilitated by chaplain Hadja Harral   Group opened with brief discussion and psycho-social ed around grief and loss in relationships and in relation to self - identifying life patterns, circumstances, changes that cause losses. Established group norm of speaking from own life experience. Group goal of establishing open and affirming space for members to share loss and experience with grief, normalize grief experience and provide psycho social education and grief support.     

## 2013-07-07 NOTE — Progress Notes (Signed)
D:  Patient's self inventory sheet, patient has poor sleep, good appetite, normal energy level, good attention span.  Rated depression and hopeless #5.  Denied SI.  Has experienced pain in past 24 hours.  Worst pain #5.  Plans to stay sober after discharge. A:  Medications administered per MD orders.  Emotional support and encouragement given patient. R:  Denied SI and HI.  Contracts for safety.  Denied A/V hallucinations.  Will continue to monitor patient for safety with 15 minute checks.  Safety maintained.

## 2013-07-07 NOTE — [facility] Group Notes (Signed)
[facility]   07/08/2013 9:54 AM    Participation Quality:  Appropraite  Mood/Affect:  Appropriate  Depression Rating:  1  Anxiety Rating:  1  Thoughts of Suicide:  No  Will you contract for safety?   NA  Current AVH:  No  Plan for Discharge/Comments:  Patient attended discharge planning group and actively participated in group.  He reports being awesome and ready to discharge home today.  [facility]. CSW provided all participants with daily workbook.   Transportation Means: Patient has transportation.   Supports:  Patient has a support system.   Andrew Chambers, Andrew Chambers

## 2013-07-07 NOTE — Progress Notes (Signed)
[facility] MD Progress Note  07/07/2013 12:26 PM Andrew Chambers  MRN:  [id number] Subjective:  Patient was seen and chart reviewed. Patient reported he has been feeling irritable and aggravated for the last 2 days but today started feeling better. Patient reported he has taken medication trazodone for insomnia which seems to be helping. Patient reported he continued to have a depression and anxiety. His depression and as 6 or 7/10 and anxiety 6/10. Patient has suicidal ideation and contracts for safety while in the hospital. Patient stated he has decided to stay sober for at least 2 weeks so that he can go to the [facility]. Patient stated he may need to go to a shelter at the time of discharged from the hospital. Patient does not want to go back to his mother home where he does not get along with his mother. Diagnosis:   DSM5: Schizophrenia Disorders:   Obsessive-Compulsive Disorders:   Trauma-Stressor Disorders:   Substance/Addictive Disorders:  Alcohol Related Disorder - Moderate (303.90) Depressive Disorders:  Disruptive Mood Dysregulation Disorder (296.99)  Axis I: Bipolar, Depressed, Substance Induced Mood Disorder and Alcohol dependence and cocaine abuse  ADL's:  Intact  Sleep: Fair  Appetite:  Fair  Suicidal Ideation:  Patient endorses suicidal ideation but contracts for safety while in the hospital Homicidal Ideation:  Denied AEB (as evidenced by):  Psychiatric Specialty Exam: Review of Systems  Constitutional: Negative.   HENT: Negative.   Eyes: Negative.   Respiratory: Negative.   Cardiovascular: Negative.   Psychiatric/Behavioral: Positive for depression, suicidal ideas and substance abuse. The patient has insomnia.     Blood pressure 126/86, pulse 62, temperature 97.9 F (36.6 C), temperature source Oral, resp. rate 20, height '5\' 10"'  (1.778 m), weight 102.059 kg (225 lb).Body mass index is 32.28 kg/(m^2).  General Appearance: Fairly Groomed  Engineer, water::  Fair  Speech:   Clear and Coherent  Volume:  Normal  Mood:  Angry, Anxious, Depressed and Irritable  Affect:  Depressed and Flat  Thought Process:  Goal Directed and Intact  Orientation:  Full (Time, Place, and Person)  Thought Content:  Rumination  Suicidal Thoughts:  Yes.  without intent/plan  Homicidal Thoughts:  No  Memory:  Immediate;   Fair  Judgement:  Fair  Insight:  Fair  Psychomotor Activity:  Normal  Concentration:  Fair  Recall:  Good  Akathisia:  NA  Handed:  Right  AIMS (if indicated):     Assets:  Communication Skills Desire for Improvement Financial Resources/Insurance Physical Health Resilience Vocational/Educational  Sleep:  Number of Hours: 6.5   Current Medications: Current Facility-Administered Medications  Medication Dose Route Frequency Provider Last Rate Last Dose  . acetaminophen (TYLENOL) tablet 650 mg  650 mg Oral Q6H PRN Shuvon Rankin, NP   650 mg at 07/06/13 0902  . alum & mag hydroxide-simeth (MAALOX/MYLANTA) 200-200-20 MG/5ML suspension 30 mL  30 mL Oral Q4H PRN Shuvon Rankin, NP      . colchicine tablet 0.6 mg  0.6 mg Oral Daily Benjamine Mola, FNP   0.6 mg at 07/07/13 0829  . divalproex (DEPAKOTE ER) 24 hr tablet 500 mg  500 mg Oral Daily Shuvon Rankin, NP   500 mg at 07/07/13 0829  . FLUoxetine (PROZAC) capsule 20 mg  20 mg Oral Daily Shuvon Rankin, NP   20 mg at 07/07/13 0830  . ibuprofen (ADVIL,MOTRIN) tablet 600 mg  600 mg Oral Q6H PRN Shuvon Rankin, NP   600 mg at 07/07/13 0832  . magnesium hydroxide (  MILK OF MAGNESIA) suspension 30 mL  30 mL Oral Daily PRN Shuvon Rankin, NP      . metoprolol tartrate (LOPRESSOR) tablet 25 mg  25 mg Oral BID Shuvon Rankin, NP   25 mg at 07/07/13 0830  . traZODone (DESYREL) tablet 50 mg  50 mg Oral QHS,MR X 1 Laverle Hobby, PA-C        Lab Results:  Results for orders placed during the hospital encounter of 07/04/13 (from the past 48 hour(s))  CBC     Status: None   Collection Time    07/05/13  2:41 PM      Result  Value Range   WBC 8.7  4.0 - 10.5 K/uL   RBC 5.12  4.22 - 5.81 MIL/uL   Hemoglobin 15.7  13.0 - 17.0 g/dL   HCT 46.9  39.0 - 52.0 %   MCV 91.6  78.0 - 100.0 fL   MCH 30.7  26.0 - 34.0 pg   MCHC 33.5  30.0 - 36.0 g/dL   RDW 13.8  11.5 - 15.5 %   Platelets 268  150 - 400 K/uL  COMPREHENSIVE METABOLIC PANEL     Status: Abnormal   Collection Time    07/05/13  2:41 PM      Result Value Range   Sodium 138  137 - 147 mEq/L   Potassium 4.0  3.7 - 5.3 mEq/L   Chloride 100  96 - 112 mEq/L   CO2 24  19 - 32 mEq/L   Glucose, Bld 101 (*) 70 - 99 mg/dL   BUN 23  6 - 23 mg/dL   Creatinine, Ser 1.34  0.50 - 1.35 mg/dL   Calcium 9.1  8.4 - 10.5 mg/dL   Total Protein 7.2  6.0 - 8.3 g/dL   Albumin 3.6  3.5 - 5.2 g/dL   AST 32  0 - 37 U/L   ALT 35  0 - 53 U/L   Alkaline Phosphatase 101  39 - 117 U/L   Total Bilirubin 0.5  0.3 - 1.2 mg/dL   GFR calc non Af Amer 64 (*) >90 mL/min   GFR calc Af Amer 74 (*) >90 mL/min   Comment: (NOTE)     The eGFR has been calculated using the CKD EPI equation.     This calculation has not been validated in all clinical situations.     eGFR's persistently <90 mL/min signify possible Chronic Kidney     Disease.  URINALYSIS, ROUTINE W REFLEX MICROSCOPIC     Status: None   Collection Time    07/05/13  2:44 PM      Result Value Range   Color, Urine YELLOW  YELLOW   APPearance CLEAR  CLEAR   Specific Gravity, Urine 1.021  1.005 - 1.030   pH 6.0  5.0 - 8.0   Glucose, UA NEGATIVE  NEGATIVE mg/dL   Hgb urine dipstick NEGATIVE  NEGATIVE   Bilirubin Urine NEGATIVE  NEGATIVE   Ketones, ur NEGATIVE  NEGATIVE mg/dL   Protein, ur NEGATIVE  NEGATIVE mg/dL   Urobilinogen, UA 0.2  0.0 - 1.0 mg/dL   Nitrite NEGATIVE  NEGATIVE   Leukocytes, UA NEGATIVE  NEGATIVE   Comment: MICROSCOPIC NOT DONE ON URINES WITH NEGATIVE PROTEIN, BLOOD, LEUKOCYTES, NITRITE, OR GLUCOSE <1000 mg/dL.    Physical Findings: AIMS: Facial and Oral Movements Muscles of Facial Expression: None,  normal Lips and Perioral Area: None, normal Jaw: None, normal Tongue: None, normal,Extremity Movements Upper (arms, wrists, hands,  fingers): None, normal Lower (legs, knees, ankles, toes): None, normal, Trunk Movements Neck, shoulders, hips: None, normal, Overall Severity Severity of abnormal movements (highest score from questions above): None, normal Incapacitation due to abnormal movements: None, normal Patient's awareness of abnormal movements (rate only patient's report): No Awareness, Dental Status Current problems with teeth and/or dentures?: No Does patient usually wear dentures?: No  CIWA:  CIWA-Ar Total: 2 COWS:  COWS Total Score: 1  Treatment Plan Summary: Daily contact with patient to assess and evaluate symptoms and progress in treatment Medication management  Plan: Treatment Plan/Recommendations:  1. Continue for crisis management and stabilization. 2. Medication management to reduce current symptoms to base line and improve the patient's overall level of functioning. Increase Depakote 750 mg mg daily for mood swings, fluoxetine 20 mg daily for depression and trazodone 50 mg at bedtime for insomnia 3. Treat health problems as indicated. 4. Develop treatment plan to decrease risk of relapse upon discharge and to reduce the need for readmission. 5. Psycho-social education regarding relapse prevention and self care. 6. Health care follow up as needed for medical problems. 7. Restart home medications where appropriate.   Medical Decision Making Problem Points:  Established problem, worsening (2), New problem, with no additional work-up planned (3), Review of last therapy session (1) and Review of psycho-social stressors (1) Data Points:  Review or order clinical lab tests (1) Review or order medicine tests (1) Review of medication regiment & side effects (2) Review of new medications or change in dosage (2)  I certify that inpatient services furnished can reasonably be  expected to improve the patient's condition.   Ashby Leflore,JANARDHAHA R. 07/07/2013, 12:26 PM

## 2013-07-07 NOTE — [facility] Suicide Risk Assessment (Signed)
[facility] INPATIENT:  Family/Significant Other Suicide Prevention Education  Suicide Prevention Education:  Patient Refusal for Family/Significant Other Suicide Prevention Education: The patient Andrew Chambers has refused to provide written consent for family/significant other to be provided Family/Significant Other Suicide Prevention Education during admission and/or prior to discharge.  Physician notified.  Patient declines collateral contact.  Wynn BankerHodnett, Julie Paolini Hairston 07/07/2013, 3:27 PM

## 2013-07-07 NOTE — Progress Notes (Signed)
The focus of this group is to educate the patient on the purpose and policies of crisis stabilization and provide a format to answer questions about their admission.  The group details unit policies and expectations of patients while admitted.  Patient attended 0900 nurse education orientation group this morning.  Patient listened attentively, appropriate affect, alert, appropriate insight and engagement.  Today patient will work on 3 goals for discharge.  

## 2013-07-07 NOTE — Progress Notes (Signed)
Recreation Therapy Notes  Animal-Assisted Activity/Therapy (AAA/T) Program Checklist/Progress Notes Patient Eligibility Criteria Checklist & Daily Group note for Rec Tx Intervention  Date: 01.20.2015 Time: 2:45pm [address]    AAA/T Program Assumption of Risk Form signed by Patient/ or Parent Legal Guardian yes  Patient is free of allergies or sever asthma yes  Patient reports no fear of animals yes  Patient reports no history of cruelty to animals yes   Patient understands his/her participation is voluntary yes  Patient washes hands before animal contact yes  Patient washes hands after animal contact yes  Behavioral Response: Appropriate  Education: Hand Washing, Appropriate Animal Interaction   Education Outcome: Acknowledges understanding   Clinical Observations/Feedback: Patient interacted appropriately with therapy dog team and peers during session.   Marykay Lexenise L Gusta Marksberry, LRT/CTRS  Andrew Chambers L 07/07/2013 4:21 PM

## 2013-07-07 NOTE — Progress Notes (Signed)
Pt came to the window to ask about his hs medications.  Most of conversation with pt was about his anger at the case manager and MD.  He feels they are not addressing his needs and says they are not doing their job.  Pt then asked if his sleep med had been increased which it had not.  Pt was aggravated that it had not been addressed.  Informed pt that PA would be notified to get an order for a repeat dose if needed.  Pt was satisfied to take the ordered dose.  Pt says he is still having suicidal thoughts.  He denies HI/AV.  Pt makes his needs known to staff.  Pt was encouraged to discuss his frustrations with his MD, then follow the chain of command if his needs were not met.   Support and encouragement offered.  Safety maintained with q15 minute checks. 

## 2013-07-08 DIAGNOSIS — F10229 Alcohol dependence with intoxication, unspecified: Secondary | ICD-10-CM

## 2013-07-08 DIAGNOSIS — F1410 Cocaine abuse, uncomplicated: Secondary | ICD-10-CM

## 2013-07-08 DIAGNOSIS — F3130 Bipolar disorder, current episode depressed, mild or moderate severity, unspecified: Secondary | ICD-10-CM

## 2013-07-08 MED ORDER — TRAZODONE HCL 50 MG PO TABS: 50.0000 mg | ORAL_TABLET | Freq: Every evening | ORAL | Status: DC | PRN

## 2013-07-08 MED ORDER — METOPROLOL TARTRATE 25 MG PO TABS: 25.0000 mg | ORAL_TABLET | Freq: Two times a day (BID) | ORAL | Status: DC

## 2013-07-08 MED ORDER — FLUOXETINE HCL 20 MG PO CAPS: 20.0000 mg | ORAL_CAPSULE | Freq: Every day | ORAL | Status: DC

## 2013-07-08 MED ORDER — COLCHICINE 0.6 MG PO TABS: 0.6000 mg | ORAL_TABLET | Freq: Every day | ORAL | Status: DC

## 2013-07-08 MED ORDER — DIVALPROEX SODIUM ER 250 MG PO TB24: 750.0000 mg | ORAL_TABLET | Freq: Every day | ORAL | Status: DC

## 2013-07-08 NOTE — Discharge Summary (Signed)
Physician Discharge Summary Note  Patient:  Andrew Chambers is an 43 y.o., male MRN:  [id number] DOB:  01-28-1971 Patient phone:  [phone] (home)  Patient address:   [place] [address],   Date of Admission:  07/05/2013 Date of Discharge: 07/08/2013  Reason for Admission:  MDD, suicidal ideation  Discharge Diagnoses: Active Problems:   MDD (major depressive disorder)   Suicidal ideation  Review of Systems  Constitutional: Negative.   HENT: Negative.   Eyes: Negative.   Respiratory: Negative.   Cardiovascular: Negative.   Gastrointestinal: Negative.   Genitourinary: Negative.   Musculoskeletal: Negative.   Skin: Negative.   Neurological: Negative.   Endo/Heme/Allergies: Negative.   Psychiatric/Behavioral: Negative.    Substance/Addictive Disorders: Cocaine abuse  Depressive Disorders: Major Depressive Disorder - Severe (296.23)  AXIS I: Major Depression, Recurrent severe and Substance Abuse  AXIS II: Deferred  DSM5:  Substance/Addictive Disorders:  Cocaine Abuse Depressive Disorders:  Major Depressive Disorder - Severe (296.23)  Axis Diagnosis:   AXIS I:  Major Depression, Recurrent severe and Substance Abuse AXIS II:  Deferred AXIS III:   Past Medical History  Diagnosis Date  . Bipolar 1 disorder   . CHF (congestive heart failure)   . Coronary artery disease   . Hypertension    AXIS IV:  housing problems, other psychosocial or environmental problems and problems related to social environment AXIS V:  61-70 mild symptoms  Level of Care:  OP  Hospital Course:   43yo caucasian male presenting to the [facility] ED, was voluntary. 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 States moved here from [place] to  help take care of his mother, bought a house and moved her in with him but can't deal with her anymore. States is ready to leave the house to her and move into an [place] where he has no more stress about caring for her, family stress, etc. States father and ex are in [place], his mother is here, tired of all the travel, has 1 son, 20. States is going to be homeless, admits to [facility], admits to 3 prior attempts. Story has inconsistencies, guarded, but stated his brother is in prison for life, feels he has lost him, and grandmother died in 2023-06-28., was very close to her. Admits to drinking daily, but intermittently (a drink), butt states that he frequently can't finish it. UDS pos for cocaine, BAL was 49 in ED. Also admits to SI, no plan, but has firearms at home. Affect is superficially bright, guarded, eye contact varies. States has been off meds for sometime.Med hx includes HTN, CHF, gout, CAD. No surgical hx. NKDA. We will restart his Depakote and add Prozac to his medications.   During admission assessment, pt denies current thoughts of SI, HI, and AVH. Pt continues to affirm that he was "a nurse for 23 yrs, but burned out," but no RN or LPN licensure records support his statement. 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 Pt continues to complain about his "gout pain" which was verified as a current dx (ED notes). Pt continues to express interest in going to [facility] upon discharge. Rates anxiety at 7/10 and depression at 6/10. Continues to report good appetite, but poor sleep. He states that he is just "sick and tired of doing the same thing, just sick and tired of being sick and tired and here to make things better".   During  Hospitalization: Medications managed, psychoeducation, group and individual therapy. Pt currently denies SI, HI, and Psychosis. At discharge, pt rates anxiety at 1/10 and depression at 1/10. Pt states that he does have a good supportive home environment and will followup with outpatient treatment at [facility]; then plans to go to [facility]. Affirms agreement with medication regimen and discharge plan. Denies other physical and psychological concerns at time of discharge.    Consults:  None  Significant Diagnostic Studies:  None  Discharge Vitals:   Blood pressure 143/84, pulse 69, temperature 97.7 F (36.5 C), temperature source Oral, resp. rate 18, height 5' 10" (1.778 m), weight 102.059 kg (225 lb). Body mass index is 32.28 kg/(m^2). Lab Results:   Results for orders placed during the hospital encounter of 07/04/13 (from the past 72 hour(s))  CBC     Status: None   Collection Time    07/05/13  2:41 PM      Result Value Range   WBC 8.7  4.0 - 10.5 K/uL   RBC 5.12  4.22 - 5.81 MIL/uL   Hemoglobin 15.7  13.0 - 17.0 g/dL   HCT 46.9  39.0 - 52.0 %   MCV 91.6  78.0 - 100.0 fL   MCH 30.7  26.0 - 34.0 pg   MCHC 33.5  30.0 - 36.0 g/dL   RDW 13.8  11.5 - 15.5 %   Platelets 268  150 - 400 K/uL  COMPREHENSIVE METABOLIC PANEL     Status: Abnormal   Collection Time    07/05/13  2:41 PM      Result Value Range   Sodium 138  137 - 147 mEq/L   Potassium 4.0  3.7 - 5.3 mEq/L   Chloride 100  96 - 112 mEq/L   CO2 24  19 - 32 mEq/L   Glucose, Bld 101 (*) 70 - 99 mg/dL   BUN 23  6 - 23 mg/dL   Creatinine, Ser 1.34  0.50 - 1.35 mg/dL   Calcium 9.1  8.4 - 10.5 mg/dL   Total Protein 7.2  6.0 - 8.3 g/dL   Albumin 3.6  3.5 - 5.2 g/dL   AST 32  0 - 37 U/L   ALT 35  0 - 53 U/L   Alkaline Phosphatase 101  39 - 117 U/L   Total Bilirubin 0.5  0.3 - 1.2 mg/dL   GFR calc non Af Amer 64 (*) >90 mL/min   GFR calc Af Amer 74 (*) >90 mL/min   Comment: (NOTE)     The eGFR has been  calculated using the CKD EPI equation.     This calculation has not been validated in all clinical situations.     eGFR's persistently <90 mL/min signify possible Chronic Kidney     Disease.  URINALYSIS, ROUTINE W REFLEX MICROSCOPIC     Status: None   Collection Time    07/05/13  2:44 PM      Result Value Range  Color, Urine YELLOW  YELLOW   APPearance CLEAR  CLEAR   Specific Gravity, Urine 1.021  1.005 - 1.030   pH 6.0  5.0 - 8.0   Glucose, UA NEGATIVE  NEGATIVE mg/dL   Hgb urine dipstick NEGATIVE  NEGATIVE   Bilirubin Urine NEGATIVE  NEGATIVE   Ketones, ur NEGATIVE  NEGATIVE mg/dL   Protein, ur NEGATIVE  NEGATIVE mg/dL   Urobilinogen, UA 0.2  0.0 - 1.0 mg/dL   Nitrite NEGATIVE  NEGATIVE   Leukocytes, UA NEGATIVE  NEGATIVE   Comment: MICROSCOPIC NOT DONE ON URINES WITH NEGATIVE PROTEIN, BLOOD, LEUKOCYTES, NITRITE, OR GLUCOSE <1000 mg/dL.    Physical Findings: AIMS: Facial and Oral Movements Muscles of Facial Expression: None, normal Lips and Perioral Area: None, normal Jaw: None, normal Tongue: None, normal,Extremity Movements Upper (arms, wrists, hands, fingers): None, normal Lower (legs, knees, ankles, toes): None, normal, Trunk Movements Neck, shoulders, hips: None, normal, Overall Severity Severity of abnormal movements (highest score from questions above): None, normal Incapacitation due to abnormal movements: None, normal Patient's awareness of abnormal movements (rate only patient's report): No Awareness, Dental Status Current problems with teeth and/or dentures?: No Does patient usually wear dentures?: No  CIWA:  CIWA-Ar Total: 1 COWS:  COWS Total Score: 1  Psychiatric Specialty Exam: See Psychiatric Specialty Exam and Suicide Risk Assessment completed by Attending Physician prior to discharge.  Discharge destination:  Home  Is patient on multiple antipsychotic therapies at discharge:  No   Has Patient had three or more failed trials of antipsychotic  monotherapy by history:  No  Recommended Plan for Multiple Antipsychotic Therapies: NA     Medication List    Notice   You have not been prescribed any medications.         Follow-up Information   [facility]. (Please go to [facility] in [facility] on  Thursday, July 09, 2013 for medication managment and substance abuse counseling.)    Contact information:   [address] [place], [place]   [address]  [phone]      Follow-up recommendations:  Activity:  As tolerated Diet:  Heart healthy with low sodium.  Comments:   Take all medications as prescribed. Keep all follow-up appointments as scheduled.  Do not consume alcohol or use illegal drugs while on prescription medications. Report any adverse effects from your medications to your primary care provider promptly.  In the event of recurrent symptoms or worsening symptoms, call 911, a crisis hotline, or go to the nearest emergency department for evaluation.   Total Discharge Time:  Greater than 30 minutes.  Signed: Benjamine Mola, FNP-BC 07/08/2013, 10:57 AM  Patient was seen for psychiatric evaluation, admission and discharge suicide risk assessment, and case discussed with the treatment team and a physician extender. Disposition plans were made to getReviewed the information documented and agree with the treatment plan.   Karsyn Jamie,JANARDHAHA R. 07/08/2013 6:31 PM

## 2013-07-08 NOTE — Progress Notes (Signed)
Adult Psychoeducational Group Note  Date:  07/07/13 Time:  8:00 pm  Group Topic/Focus:  Wrap-Up Group:   The focus of this group is to help patients review their daily goal of treatment and discuss progress on daily workbooks.  Participation Level:  Active  Participation Quality:  Appropriate and Sharing  Affect:  Appropriate  Cognitive:  Appropriate  Insight: Appropriate  Engagement in Group:  Engaged  Modes of Intervention:  Discussion, Education, Socialization and Support  Additional Comments:  Pt stated that he has very supportive family and boss. Pt stated that he is a helpful and smart person.   Andrew Chambers 07/08/2013, 12:05 AM

## 2013-07-08 NOTE — Progress Notes (Signed)
Patient ID: Andrew Chambers, male   DOB: 05-Jul-1970, 43 y.o.   MRN: [id number] Patient discharged per physician order; patient denies SI/HI and A/V hallucinations; patient received samples, prescriptions, and copy of AVS after it was reviewed; patient had no other questions or concerns at this time; patient verbalized and signed that he received all belongings; patient left the unit ambulatory

## 2013-07-08 NOTE — Progress Notes (Signed)
[facility] :  Will you be returning to the same living situation after discharge: No.  Patient exploring other options. At discharge, do you have transportation home?:Yes,  Patient assisted with a bus pass. Do you have the ability to pay for your medications:No.  Patient needs assistance with indigent medications   Release of information consent forms completed and in the chart;  Patient's signature needed at discharge.  Patient to Follow up at: Follow-up Information   [facility]. (Please go to [facility] on  Thursday, July 09, 2013 for medication managment and substance abuse counseling.)    Contact information:   [address] [place]   [id number]  [phone]      Patient denies SI/HI:   Patient no longer endorsing SI/HI or other thoughts of self harm.   Safety Planning and Suicide Prevention discussed: .Reviewed with all patients during discharge planning group   Aydee Mcnew, Joesph JulyQuylle Hairston 07/08/2013, 10:58 AM

## 2013-07-08 NOTE — Tx Team (Signed)
Interdisciplinary Treatment Plan Update   Date Reviewed:  07/08/2013  Time Reviewed:  8:37 AM  Progress in Treatment:   Attending groups: Yes Participating in groups: Yes Taking medication as prescribed: Yes  Tolerating medication: Yes Family/Significant other contact made: No contact with family. Patient understands diagnosis: Yes  Discussing patient identified problems/goals with staff: Yes Medical problems stabilized or resolved: Yes Denies suicidal/homicidal ideation: Yes Patient has not harmed self or others: Yes  For review of initial/current patient goals, please see plan of care.  Estimated Length of Stay:  Discharge today  Reasons for Continued Hospitalization:   New Problems/Goals identified:    Discharge Plan or Barriers:   Home with outpatient follow up [facility]  Additional Comments: N/A  Attendees:  Patient:  Andrew Chambers 07/08/2013 8:37 AM   Signature: Mervyn GayJ. Jonnalagadda, MD 07/08/2013 8:37 AM  Signature:  07/08/2013 8:37 AM  Signature:  Claudette Headonrad Withrow, NP 07/08/2013 8:37 AM  Signature: 07/08/2013 8:37 AM  Signature:  07/08/2013 8:37 AM  Signature:  Juline PatchQuylle Kallie Depolo, LCSW 07/08/2013 8:37 AM  Signature:  Reyes Ivanhelsea Horton, LCSW 07/08/2013 8:37 AM  Signature:  Leisa LenzValerie Enoch, Care Coordinator 07/08/2013 8:37 AM  Signature:  Aloha GellKrista Dopson, RN 07/08/2013 8:37 AM  Signature: Leighton ParodyBritney Tyson, RN 07/08/2013  8:37 AM  Signature:   Onnie BoerJennifer Clark, RN [facility] 07/08/2013  8:37 AM  Signature:   07/08/2013  8:37 AM    Scribe for Treatment Team:   Juline PatchQuylle Doc Mandala,  07/08/2013 8:37 AM

## 2013-07-08 NOTE — [facility] Group Notes (Signed)
[facility]   07/08/2013 11:00 AM    Participation Quality:  Appropraite  Mood/Affect:  Appropriate  Depression Rating:  1  Anxiety Rating:  1  Thoughts of Suicide:  No  Will you contract for safety?   NA  Current AVH:  No  Plan for Discharge/Comments:  Patient attended discharge planning group and actively participated in group.  Patient reports he is awesome and ready to discharge home today.  [facility]  CSW provided all participants with daily workbook.   Transportation Means: Patient has transportation.   Supports:  Patient has a support system.   Nathali Vent, Joesph JulyQuylle Hairston

## 2013-07-08 NOTE — Progress Notes (Signed)
Pt reports his day has gone much better today than yesterday.  He says [facility] plans [facility] until he can get into a treatment program.  He says he was able to talk with his mother on the phone and had a good conversation with her.  He denies SI/HI/AV at this time.  Pt makes his needs known and is med compliant on the unit.  Pt has been observed in the dayroom talking with peers and playing cards.  He says he has been going to groups and participating.  Support and encouragement offered.  Safety maintained with q15 minute checks.

## 2013-07-08 NOTE — [facility] Suicide Risk Assessment (Signed)
Suicide Risk Assessment  Discharge Assessment     Demographic Factors:  Male, Adolescent or young adult, Caucasian and Low socioeconomic status  Mental Status Per Nursing Assessment::   On Admission:  Self-harm thoughts  Current Mental Status by Physician: Mental Status Examination: Patient appeared as per his stated age, casually dressed, and fairly groomed, and maintaining good eye contact. Patient has good mood and his affect was constricted. He has normal rate, rhythm, and volume of speech. His thought process is linear and goal directed. Patient has denied suicidal, homicidal ideations, intentions or plans. Patient has no evidence of auditory or visual hallucinations, delusions, and paranoia. Patient has fair insight judgment and impulse control.  Loss Factors: Financial problems/change in socioeconomic status  Historical Factors: Impulsivity  Risk Reduction Factors:   Sense of responsibility to family, Religious beliefs about death, Living with another person, especially a relative, Positive social support, Positive therapeutic relationship and Positive coping skills or problem solving skills  Continued Clinical Symptoms:  Depression:   Recent sense of peace/wellbeing Alcohol/Substance Abuse/Dependencies Unstable or Poor Therapeutic Relationship Previous Psychiatric Diagnoses and Treatments  Cognitive Features That Contribute To Risk:  Polarized thinking    Suicide Risk:  Minimal: No identifiable suicidal ideation.  Patients presenting with no risk factors but with morbid ruminations; may be classified as minimal risk based on the severity of the depressive symptoms  Discharge Diagnoses:   AXIS I:  Bipolar, Depressed, Substance Induced Mood Disorder and Alcohol intoxication and cocaine abuse AXIS II:  Deferred AXIS III:   Past Medical History  Diagnosis Date  . Bipolar 1 disorder   . CHF (congestive heart failure)   . Coronary artery disease   . Hypertension     AXIS IV:  economic problems, housing problems, other psychosocial or environmental problems, problems related to social environment and problems with primary support group AXIS V:  61-70 mild symptoms  Plan Of Care/Follow-up recommendations:  Activity:  As tolerated Diet:  Regular  Is patient on multiple antipsychotic therapies at discharge:  No   Has Patient had three or more failed trials of antipsychotic monotherapy by history:  No  Recommended Plan for Multiple Antipsychotic Therapies: NA  Emanuele Mcwhirter,JANARDHAHA R. 07/08/2013, 11:54 AM

## 2013-07-13 NOTE — Progress Notes (Signed)
Patient Discharge Instructions:  After Visit Summary (AVS):   Faxed to:  07/13/13 Discharge Summary Note:   Faxed to:  07/13/13 Psychiatric Admission Assessment Note:   Faxed to:  07/13/13 Suicide Risk Assessment - Discharge Assessment:   Faxed to:  07/13/13 Faxed/Sent to the Next Level Care provider:  07/13/13 [facility] to [facility] @ [phone]  Jerelene ReddenSheena E [place], 07/13/2013, 4:17 PM

## 2013-07-22 ENCOUNTER — Encounter (HOSPITAL_COMMUNITY): Payer: Self-pay | Admitting: Emergency Medicine

## 2013-07-22 ENCOUNTER — Inpatient Hospital Stay (HOSPITAL_COMMUNITY)
Admission: AD | Admit: 2013-07-22 | Discharge: 2013-07-25 | DRG: 885 | Disposition: A | Discharge status: Rehabilitation Facility | Payer: No Typology Code available for payment source | Source: Intra-hospital | Attending: Psychiatry | Admitting: Psychiatry

## 2013-07-22 ENCOUNTER — Emergency Department (HOSPITAL_COMMUNITY)
Admission: EM | Admit: 2013-07-22 | Discharge: 2013-07-22 | Disposition: A | Discharge status: Other Acute Inpatient Hospital | Payer: No Typology Code available for payment source | Attending: Emergency Medicine | Admitting: Emergency Medicine

## 2013-07-22 ENCOUNTER — Encounter (HOSPITAL_COMMUNITY): Payer: Self-pay | Admitting: Behavioral Health

## 2013-07-22 DIAGNOSIS — R45851 Suicidal ideations: Secondary | ICD-10-CM

## 2013-07-22 DIAGNOSIS — I509 Heart failure, unspecified: Secondary | ICD-10-CM | POA: Diagnosis present

## 2013-07-22 DIAGNOSIS — I2510 Atherosclerotic heart disease of native coronary artery without angina pectoris: Secondary | ICD-10-CM | POA: Diagnosis present

## 2013-07-22 DIAGNOSIS — F1410 Cocaine abuse, uncomplicated: Secondary | ICD-10-CM | POA: Diagnosis present

## 2013-07-22 DIAGNOSIS — F1010 Alcohol abuse, uncomplicated: Secondary | ICD-10-CM | POA: Diagnosis present

## 2013-07-22 DIAGNOSIS — F17200 Nicotine dependence, unspecified, uncomplicated: Secondary | ICD-10-CM | POA: Diagnosis present

## 2013-07-22 DIAGNOSIS — F1994 Other psychoactive substance use, unspecified with psychoactive substance-induced mood disorder: Secondary | ICD-10-CM | POA: Diagnosis present

## 2013-07-22 DIAGNOSIS — F10229 Alcohol dependence with intoxication, unspecified: Secondary | ICD-10-CM

## 2013-07-22 DIAGNOSIS — F411 Generalized anxiety disorder: Secondary | ICD-10-CM | POA: Diagnosis present

## 2013-07-22 DIAGNOSIS — F329 Major depressive disorder, single episode, unspecified: Secondary | ICD-10-CM | POA: Insufficient documentation

## 2013-07-22 DIAGNOSIS — I10 Essential (primary) hypertension: Secondary | ICD-10-CM | POA: Insufficient documentation

## 2013-07-22 DIAGNOSIS — F3160 Bipolar disorder, current episode mixed, unspecified: Secondary | ICD-10-CM | POA: Diagnosis present

## 2013-07-22 DIAGNOSIS — F14120 Cocaine abuse with intoxication, uncomplicated: Secondary | ICD-10-CM

## 2013-07-22 DIAGNOSIS — F10129 Alcohol abuse with intoxication, unspecified: Secondary | ICD-10-CM

## 2013-07-22 DIAGNOSIS — M109 Gout, unspecified: Secondary | ICD-10-CM | POA: Insufficient documentation

## 2013-07-22 DIAGNOSIS — F911 Conduct disorder, childhood-onset type: Secondary | ICD-10-CM | POA: Insufficient documentation

## 2013-07-22 HISTORY — DX: Major depressive disorder, single episode, unspecified: F32.9

## 2013-07-22 HISTORY — DX: Depression, unspecified: F32.A

## 2013-07-22 LAB — CBC WITH DIFFERENTIAL/PLATELET
BASOS ABS: 0.1 10*3/uL (ref 0.0–0.1)
Basophils Relative: 1 % (ref 0–1)
EOS ABS: 0.1 10*3/uL (ref 0.0–0.7)
Eosinophils Relative: 2 % (ref 0–5)
HCT: 48.5 % (ref 39.0–52.0)
Hemoglobin: 17 g/dL (ref 13.0–17.0)
Lymphocytes Relative: 35 % (ref 12–46)
Lymphs Abs: 2.2 10*3/uL (ref 0.7–4.0)
MCH: 31.2 pg (ref 26.0–34.0)
MCHC: 35.1 g/dL (ref 30.0–36.0)
MCV: 89 fL (ref 78.0–100.0)
Monocytes Absolute: 0.8 10*3/uL (ref 0.1–1.0)
Monocytes Relative: 13 % — ABNORMAL HIGH (ref 3–12)
NEUTROS PCT: 49 % (ref 43–77)
Neutro Abs: 3.1 10*3/uL (ref 1.7–7.7)
PLATELETS: 274 10*3/uL (ref 150–400)
RBC: 5.45 MIL/uL (ref 4.22–5.81)
RDW: 13.5 % (ref 11.5–15.5)
WBC: 6.3 10*3/uL (ref 4.0–10.5)

## 2013-07-22 LAB — POCT I-STAT, CHEM 8
BUN: 8 mg/dL (ref 6–23)
CHLORIDE: 107 meq/L (ref 96–112)
Calcium, Ion: 1.12 mmol/L (ref 1.12–1.23)
Creatinine, Ser: 1.2 mg/dL (ref 0.50–1.35)
Glucose, Bld: 102 mg/dL — ABNORMAL HIGH (ref 70–99)
HCT: 54 % — ABNORMAL HIGH (ref 39.0–52.0)
Hemoglobin: 18.4 g/dL — ABNORMAL HIGH (ref 13.0–17.0)
POTASSIUM: 3.8 meq/L (ref 3.7–5.3)
Sodium: 143 mEq/L (ref 137–147)
TCO2: 24 mmol/L (ref 0–100)

## 2013-07-22 LAB — RAPID URINE DRUG SCREEN, HOSP PERFORMED
Amphetamines: NOT DETECTED
Barbiturates: NOT DETECTED
Benzodiazepines: NOT DETECTED
Cocaine: NOT DETECTED
OPIATES: NOT DETECTED
Tetrahydrocannabinol: NOT DETECTED

## 2013-07-22 LAB — ETHANOL: ALCOHOL ETHYL (B): 185 mg/dL — AB (ref 0–11)

## 2013-07-22 LAB — VALPROIC ACID LEVEL: Valproic Acid Lvl: 15.9 ug/mL — ABNORMAL LOW (ref 50.0–100.0)

## 2013-07-22 MED ORDER — ZOLPIDEM TARTRATE 5 MG PO TABS: 5.0000 mg | ORAL_TABLET | Freq: Every evening | ORAL | Status: DC | PRN

## 2013-07-22 MED ORDER — METOPROLOL TARTRATE 25 MG PO TABS
25.0000 mg | ORAL_TABLET | Freq: Two times a day (BID) | ORAL | Status: DC
  Administered 2013-07-22 – 2013-07-25 (×6): 25 mg via ORAL
  Filled 2013-07-22 (×3): qty 1
  Filled 2013-07-22: qty 28
  Filled 2013-07-22: qty 1
  Filled 2013-07-22: qty 28
  Filled 2013-07-22 (×5): qty 1

## 2013-07-22 MED ORDER — DIVALPROEX SODIUM ER 500 MG PO TB24
750.0000 mg | ORAL_TABLET | Freq: Every day | ORAL | Status: DC
  Administered 2013-07-23 – 2013-07-25 (×3): 750 mg via ORAL
  Filled 2013-07-22 (×4): qty 1

## 2013-07-22 MED ORDER — FLUOXETINE HCL 20 MG PO CAPS
20.0000 mg | ORAL_CAPSULE | Freq: Every day | ORAL | Status: DC
  Administered 2013-07-22: 20 mg via ORAL
  Filled 2013-07-22 (×2): qty 1

## 2013-07-22 MED ORDER — TRAZODONE HCL 50 MG PO TABS: 50.0000 mg | ORAL_TABLET | Freq: Every day | ORAL | Status: DC

## 2013-07-22 MED ORDER — TRAZODONE HCL 50 MG PO TABS
50.0000 mg | ORAL_TABLET | Freq: Every day | ORAL | Status: DC
  Administered 2013-07-22 – 2013-07-24 (×3): 50 mg via ORAL
  Filled 2013-07-22: qty 14
  Filled 2013-07-22 (×5): qty 1

## 2013-07-22 MED ORDER — NICOTINE 21 MG/24HR TD PT24
21.0000 mg | MEDICATED_PATCH | Freq: Every day | TRANSDERMAL | Status: DC
  Filled 2013-07-22: qty 1

## 2013-07-22 MED ORDER — DIPHENHYDRAMINE HCL 25 MG PO CAPS
50.0000 mg | ORAL_CAPSULE | Freq: Once | ORAL | Status: AC
  Administered 2013-07-22: 50 mg via ORAL
  Filled 2013-07-22: qty 2

## 2013-07-22 MED ORDER — METOPROLOL TARTRATE 25 MG PO TABS
25.0000 mg | ORAL_TABLET | Freq: Two times a day (BID) | ORAL | Status: DC
  Administered 2013-07-22: 25 mg via ORAL
  Filled 2013-07-22: qty 1

## 2013-07-22 MED ORDER — IBUPROFEN 200 MG PO TABS
600.0000 mg | ORAL_TABLET | Freq: Three times a day (TID) | ORAL | Status: DC | PRN
  Filled 2013-07-22: qty 3

## 2013-07-22 MED ORDER — COLCHICINE 0.6 MG PO TABS
0.6000 mg | ORAL_TABLET | Freq: Every day | ORAL | Status: DC
  Administered 2013-07-23 – 2013-07-25 (×3): 0.6 mg via ORAL
  Filled 2013-07-22 (×2): qty 1
  Filled 2013-07-22: qty 14
  Filled 2013-07-22 (×2): qty 1

## 2013-07-22 MED ORDER — HALOPERIDOL 5 MG PO TABS
5.0000 mg | ORAL_TABLET | Freq: Four times a day (QID) | ORAL | Status: DC | PRN
  Filled 2013-07-22: qty 30

## 2013-07-22 MED ORDER — DIVALPROEX SODIUM ER 500 MG PO TB24
750.0000 mg | ORAL_TABLET | Freq: Every day | ORAL | Status: DC
  Administered 2013-07-22: 750 mg via ORAL
  Filled 2013-07-22 (×2): qty 1

## 2013-07-22 MED ORDER — ALUM & MAG HYDROXIDE-SIMETH 200-200-20 MG/5ML PO SUSP: 30.0000 mL | ORAL | Status: DC | PRN

## 2013-07-22 MED ORDER — ACETAMINOPHEN 325 MG PO TABS: 650.0000 mg | ORAL_TABLET | Freq: Four times a day (QID) | ORAL | Status: DC | PRN

## 2013-07-22 MED ORDER — COLCHICINE 0.6 MG PO TABS
0.6000 mg | ORAL_TABLET | Freq: Every day | ORAL | Status: DC
  Administered 2013-07-22: 0.6 mg via ORAL
  Filled 2013-07-22: qty 1

## 2013-07-22 MED ORDER — MAGNESIUM HYDROXIDE 400 MG/5ML PO SUSP: 30.0000 mL | Freq: Every day | ORAL | Status: DC | PRN

## 2013-07-22 MED ORDER — LORAZEPAM 1 MG PO TABS: 1.0000 mg | ORAL_TABLET | Freq: Three times a day (TID) | ORAL | Status: DC | PRN

## 2013-07-22 MED ORDER — FLUOXETINE HCL 20 MG PO CAPS
20.0000 mg | ORAL_CAPSULE | Freq: Every day | ORAL | Status: DC
  Administered 2013-07-23 – 2013-07-25 (×3): 20 mg via ORAL
  Filled 2013-07-22: qty 14
  Filled 2013-07-22 (×4): qty 1

## 2013-07-22 MED ORDER — HALOPERIDOL 5 MG PO TABS
5.0000 mg | ORAL_TABLET | Freq: Four times a day (QID) | ORAL | Status: DC | PRN
  Administered 2013-07-22: 5 mg via ORAL
  Filled 2013-07-22: qty 1
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
## 2013-07-22 NOTE — Tx Team (Signed)
Initial Interdisciplinary Treatment Plan  PATIENT STRENGTHS: (choose at least two) Active sense of humor Average or above average intelligence Communication skills Physical Health  PATIENT STRESSORS: Marital or family conflict Substance abuse   PROBLEM LIST: Problem List/Patient Goals Date to be addressed Date deferred Reason deferred Estimated date of resolution  Substance Abuse 07/22/2013   07/29/2013  Suicidal Ideations 07/22/2013   07/29/2013  Anger/Aggression 07/22/2013   07/29/2013  Family Conflict 07/22/2013   07/29/2013                                 DISCHARGE CRITERIA:  Adequate post-discharge living arrangements Improved stabilization in mood, thinking, and/or behavior Motivation to continue treatment in a less acute level of care Need for constant or close observation no longer present Reduction of life-threatening or endangering symptoms to within safe limits Verbal commitment to aftercare and medication compliance Withdrawal symptoms are absent or subacute and managed without 24-hour nursing intervention  PRELIMINARY DISCHARGE PLAN: Attend 12-step recovery group Placement in alternative living arrangements  PATIENT/FAMIILY INVOLVEMENT: This treatment plan has been presented to and reviewed with the patient, Andrew Chambers, and/or family member.  The patient and family have been given the opportunity to ask questions and make suggestions.  Andrew Chambers 07/22/2013, 5:51 PM

## 2013-07-22 NOTE — Progress Notes (Signed)
[facility] pt accepted by Dr. Kellie Shropshireaylor/ Shuvon Rankin, NP and bed is anticipated to be available later today.   Byrd HesselbachKristen Aliyyah Riese, LCSW [phone]  ED CSW 07/22/2013 1204pm
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
## 2013-07-22 NOTE — Consult Note (Signed)
Psychiatric Consult  Subjective  Patient states that he is depressed and having suicidal thoughts.  "I have been depressed for years.  I am suicidal; I don't know if I want to kill my self or just die.  I just don't want to be here.  I have tried to kill myself before.  I jumped off of a bridge; but I didn't die."  Patient continues to endorse suicidal ideation.  Patient denies homicidal ideation, psychosis, and paranoia.    Face to face interview with Dr. Ladona Ridgelaylor.  Agree with TTS assessment for inpatient treatment.  Patient has been accepted to [facility].  Bed will be available this evening. Juanna CaoEric Kaplin RN will call when bed assignment is made.  Mishawn Hemann B. Courtland Coppa FNP-BC

## 2013-07-22 NOTE — ED Notes (Signed)
Pt has been seen and searched by security.  Pt has 2 bags of belongings( One leather coat).

## 2013-07-22 NOTE — [facility] Counselor (Signed)
Dr. Ladona Ridgelaylor has accepted pt pending 500 hall bed.  TTS will continue to seek other placement.

## 2013-07-22 NOTE — Discharge Instructions (Signed)
Bipolar Disorder °Bipolar disorder is a mental illness. The term bipolar disorder actually is used to describe a group of disorders that all share varying degrees of emotional highs and lows that can interfere with daily functioning, such as work, school, or relationships. Bipolar disorder also can lead to drug abuse, hospitalization, and suicide. °The emotional highs of bipolar disorder are periods of elation or irritability and high energy. These highs can range from a mild form (hypomania) to a severe form (mania). People experiencing episodes of hypomania may appear energetic, excitable, and highly productive. People experiencing mania may behave impulsively or erratically. They often make poor decisions. They may have difficulty sleeping. The most severe episodes of mania can involve having very distorted beliefs or perceptions about the world and seeing or hearing things that are not real (psychotic delusions and hallucinations).  °The emotional lows of bipolar disorder (depression) also can range from mild to severe. Severe episodes of bipolar depression can involve psychotic delusions and hallucinations. °Sometimes people with bipolar disorder experience a state of mixed mood. Symptoms of hypomania or mania and depression are both present during this mixed-mood episode. °SIGNS AND SYMPTOMS °There are signs and symptoms of the episodes of hypomania and mania as well as the episodes of depression. The signs and symptoms of hypomania and mania are similar but vary in severity. They include: °· Inflated self-esteem or feeling of increased self-confidence. °· Decreased need for sleep. °· Unusual talkativeness (rapid or pressured speech) or the feeling of a need to keep talking. °· Sensation of racing thoughts or constant talking, with quick shifts between topics that may or may not be related (flight of ideas). °· Decreased ability to focus or concentrate. °· Increased purposeful activity, such as work, studies,  or social activity, or nonproductive activity, such as pacing, squirming and fidgeting, or finger and toe tapping. °· Impulsive behavior and use of poor judgment, resulting in high-risk activities, such as having unprotected sex or spending excessive amounts of money. °Signs and symptoms of depression include the following:  °· Feelings of sadness, hopelessness, or helplessness. °· Frequent or uncontrollable episodes of crying. °· Lack of feeling anything or caring about anything. °· Difficulty sleeping or sleeping too much.  °· Inability to enjoy the things you used to enjoy.   °· Desire to be alone all the time.   °· Feelings of guilt or worthlessness.  °· Lack of energy or motivation.   °· Difficulty concentrating, remembering, or making decisions.  °· Change in appetite or weight beyond normal fluctuations. °· Thoughts of death or the desire to harm yourself. °DIAGNOSIS  °Bipolar disorder is diagnosed through an assessment by your caregiver. Your caregiver will ask questions about your emotional episodes. There are two main types of bipolar disorder. People with type I bipolar disorder have manic episodes with or without depressive episodes. People with type II bipolar disorder have hypomanic episodes and major depressive episodes, which are more serious than mild depression. The type of bipolar disorder you have can make an important difference in how your illness is monitored and treated. °Your caregiver may ask questions about your medical history and use of alcohol or drugs, including prescription medication. Certain medical conditions and substances also can cause emotional highs and lows that resemble bipolar disorder (secondary bipolar disorder).  °TREATMENT  °Bipolar disorder is a long-term illness. It is best controlled with continuous treatment rather than treatment only when symptoms occur. The following treatments can be prescribed for bipolar disorders: °· Medication Medication can be prescribed by  a doctor   that is an expert in treating mental disorders (psychiatrists). Medications called mood stabilizers are usually prescribed to help control the illness. Other medications are sometimes added if symptoms of mania, depression, or psychotic delusions and hallucinations occur despite the use of a mood stabilizer.  Talk therapy Some forms of talk therapy are helpful in providing support, education, and guidance. A combination of medication and talk therapy is best for managing the disorder over time. A procedure in which electricity is applied to your brain through your scalp (electroconvulsive therapy) is used in cases of severe mania when medication and talk therapy do not work or work too slowly. Document Released: 09/10/2000 Document Revised: 09/29/2012 Document Reviewed: 06/30/2012 [facility] Patient Information 2014 [place], [place].  Depression, Adult Depression refers to feeling sad, low, down in the dumps, blue, gloomy, or empty. In general, there are two kinds of depression:  Depression that we all experience from time to time because of upsetting life experiences, including the loss of a job or the ending of a relationship (normal sadness or normal grief). This kind of depression is considered normal, is short lived, and resolves within a few days to 2 weeks. (Depression experienced after the loss of a loved one is called bereavement. Bereavement often lasts longer than 2 weeks but normally gets better with time.)  Clinical depression, which lasts longer than normal sadness or normal grief or interferes with your ability to function at home, at work, and in school. It also interferes with your personal relationships. It affects almost every aspect of your life. Clinical depression is an illness. Symptoms of depression also can be caused by conditions other than normal sadness and grief or clinical depression. Examples of these conditions are listed as follows:  Physical illness Some  physical illnesses, including underactive thyroid gland (hypothyroidism), severe anemia, specific types of cancer, diabetes, uncontrolled seizures, heart and lung problems, strokes, and chronic pain are commonly associated with symptoms of depression.  Side effects of some prescription medicine In some people, certain types of prescription medicine can cause symptoms of depression.  Substance abuse Abuse of alcohol and illicit drugs can cause symptoms of depression. SYMPTOMS Symptoms of normal sadness and normal grief include the following:  Feeling sad or crying for short periods of time.  Not caring about anything (apathy).  Difficulty sleeping or sleeping too much.  No longer able to enjoy the things you used to enjoy.  Desire to be by oneself all the time (social isolation).  Lack of energy or motivation.  Difficulty concentrating or remembering.  Change in appetite or weight.  Restlessness or agitation. Symptoms of clinical depression include the same symptoms of normal sadness or normal grief and also the following symptoms:  Feeling sad or crying all the time.  Feelings of guilt or worthlessness.  Feelings of hopelessness or helplessness.  Thoughts of suicide or the desire to harm yourself (suicidal ideation).  Loss of touch with reality (psychotic symptoms). Seeing or hearing things that are not real (hallucinations) or having false beliefs about your life or the people around you (delusions and paranoia). DIAGNOSIS  The diagnosis of clinical depression usually is based on the severity and duration of the symptoms. Your caregiver also will ask you questions about your medical history and substance use to find out if physical illness, use of prescription medicine, or substance abuse is causing your depression. Your caregiver also may order blood tests. TREATMENT  Typically, normal sadness and normal grief do not require treatment. However, sometimes antidepressant  medicine is prescribed for bereavement to ease the depressive symptoms until they resolve. The treatment for clinical depression depends on the severity of your symptoms but typically includes antidepressant medicine, counseling with a mental health professional, or a combination of both. Your caregiver will help to determine what treatment is best for you. Depression caused by physical illness usually goes away with appropriate medical treatment of the illness. If prescription medicine is causing depression, talk with your caregiver about stopping the medicine, decreasing the dose, or substituting another medicine. Depression caused by abuse of alcohol or illicit drugs abuse goes away with abstinence from these substances. Some adults need professional help in order to stop drinking or using drugs. SEEK IMMEDIATE CARE IF:  You have thoughts about hurting yourself or others.  You lose touch with reality (have psychotic symptoms).  You are taking medicine for depression and have a serious side effect. FOR MORE INFORMATION National [employer] Mental Illness: www.nami.[employer]: http://www.maynard.net/ Document Released: 06/01/2000 Document Revised: 12/04/2011 Document Reviewed: 09/03/2011 [facility] Patient Information 2014 [place], [place].  Depression, Adult Depression refers to feeling sad, low, down in the dumps, blue, gloomy, or empty. In general, there are two kinds of depression:  Depression that we all experience from time to time because of upsetting life experiences, including the loss of a job or the ending of a relationship (normal sadness or normal grief). This kind of depression is considered normal, is short lived, and resolves within a few days to 2 weeks. (Depression experienced after the loss of a loved one is called bereavement. Bereavement often lasts longer than 2 weeks but normally gets better with time.)  Clinical depression, which lasts longer than  normal sadness or normal grief or interferes with your ability to function at home, at work, and in school. It also interferes with your personal relationships. It affects almost every aspect of your life. Clinical depression is an illness. Symptoms of depression also can be caused by conditions other than normal sadness and grief or clinical depression. Examples of these conditions are listed as follows:  Physical illness Some physical illnesses, including underactive thyroid gland (hypothyroidism), severe anemia, specific types of cancer, diabetes, uncontrolled seizures, heart and lung problems, strokes, and chronic pain are commonly associated with symptoms of depression.  Side effects of some prescription medicine In some people, certain types of prescription medicine can cause symptoms of depression.  Substance abuse Abuse of alcohol and illicit drugs can cause symptoms of depression. SYMPTOMS Symptoms of normal sadness and normal grief include the following:  Feeling sad or crying for short periods of time.  Not caring about anything (apathy).  Difficulty sleeping or sleeping too much.  No longer able to enjoy the things you used to enjoy.  Desire to be by oneself all the time (social isolation).  Lack of energy or motivation.  Difficulty concentrating or remembering.  Change in appetite or weight.  Restlessness or agitation. Symptoms of clinical depression include the same symptoms of normal sadness or normal grief and also the following symptoms:  Feeling sad or crying all the time.  Feelings of guilt or worthlessness.  Feelings of hopelessness or helplessness.  Thoughts of suicide or the desire to harm yourself (suicidal ideation).  Loss of touch with reality (psychotic symptoms). Seeing or hearing things that are not real (hallucinations) or having false beliefs about your life or the people around you (delusions and paranoia). DIAGNOSIS  The diagnosis of clinical  depression usually is based on  the severity and duration of the symptoms. Your caregiver also will ask you questions about your medical history and substance use to find out if physical illness, use of prescription medicine, or substance abuse is causing your depression. Your caregiver also may order blood tests. TREATMENT  Typically, normal sadness and normal grief do not require treatment. However, sometimes antidepressant medicine is prescribed for bereavement to ease the depressive symptoms until they resolve. The treatment for clinical depression depends on the severity of your symptoms but typically includes antidepressant medicine, counseling with a mental health professional, or a combination of both. Your caregiver will help to determine what treatment is best for you. Depression caused by physical illness usually goes away with appropriate medical treatment of the illness. If prescription medicine is causing depression, talk with your caregiver about stopping the medicine, decreasing the dose, or substituting another medicine. Depression caused by abuse of alcohol or illicit drugs abuse goes away with abstinence from these substances. Some adults need professional help in order to stop drinking or using drugs. SEEK IMMEDIATE CARE IF:  You have thoughts about hurting yourself or others.  You lose touch with reality (have psychotic symptoms).  You are taking medicine for depression and have a serious side effect. FOR MORE INFORMATION National Alliance on Mental Illness: www.nami.[employer]: http://www.maynard.net/ Document Released: 06/01/2000 Document Revised: 12/04/2011 Document Reviewed: 09/03/2011 [facility] Patient Information 2014 [place], [place].  Purine Restricted Diet A low-purine diet consists of foods that reduce uric acid made in your body. INDICATIONS FOR USE  Your caregiver may ask you to follow a low-purine diet to reduce gout flairs.  GUIDELINES    Avoid high-purine foods, including all alcohol, yeast extracts taken as supplements, and sauces made from meats (like gravy). Do not eat high-purine meats, including anchovies, sardines, herring, mussels, tuna, codfish, scallops, trout, haddock, bacon, organ meats, tripe, goose, wild game, and sweetbreads.  Grains  Allowed/Recommended: All, except those listed to consume in moderation.  Consume in Moderation: Oatmeal ( cup uncooked daily), wheat bran or germ ( cup daily), and whole grains. Vegetables  Allowed/Recommended: All, except those listed to consume in moderation.  Consume in Moderation: Asparagus, cauliflower, spinach, mushrooms, and green peas ( cup daily). Fruit  Allowed/Recommended: All.  Consume in Moderation: None. Meat and Meat Substitutes  Allowed/Recommended: Eggs, nuts, and peanut butter.  Consume in Moderation: Limit to 4 to 6 oz daily. Avoid high-purine meats. Lentils, peas, and dried beans (1 cup daily). Milk  Allowed/Recommended: All. Choose low-fat or skim when possible.  Consume in Moderation: None. Fats and Oils  Allowed/Recommended: All.  Consume in Moderation: None. Beverages  Allowed/Recommended: All, except those listed to avoid.  Avoid: All alcohol. Condiments/Miscellaneous  Allowed/Recommended: All, except those listed to consume in moderation.  Consume in Moderation: Bouillon and meat-based broths and soups. Document Released: 09/29/2010 Document Revised: 08/27/2011 Document Reviewed: 09/29/2010 [facility] Patient Information 2014 [place], [place].  Gout Gout is an inflammatory arthritis caused by a buildup of uric acid crystals in the joints. Uric acid is a chemical that is normally present in the blood. When the level of uric acid in the blood is too high it can form crystals that deposit in your joints and tissues. This causes joint redness, soreness, and swelling (inflammation). Repeat attacks are common. Over time, uric acid crystals  can form into masses (tophi) near a joint, destroying bone and causing disfigurement. Gout is treatable and often preventable. CAUSES  The disease begins with elevated levels of uric  acid in the blood. Uric acid is produced by your body when it breaks down a naturally found substance called purines. Certain foods you eat, such as meats and fish, contain high amounts of purines. Causes of an elevated uric acid level include:  Being passed down from parent to child (heredity).  Diseases that cause increased uric acid production (such as obesity, psoriasis, and certain cancers).  Excessive alcohol use.  Diet, especially diets rich in meat and seafood.  Medicines, including certain cancer-fighting medicines (chemotherapy), water pills (diuretics), and aspirin.  Chronic kidney disease. The kidneys are no longer able to remove uric acid well.  Problems with metabolism. Conditions strongly associated with gout include:  Obesity.  High blood pressure.  High cholesterol.  Diabetes. Not everyone with elevated uric acid levels gets gout. It is not understood why some people get gout and others do not. Surgery, joint injury, and eating too much of certain foods are some of the factors that can lead to gout attacks. SYMPTOMS   An attack of gout comes on quickly. It causes intense pain with redness, swelling, and warmth in a joint.  Fever can occur.  Often, only one joint is involved. Certain joints are more commonly involved:  Base of the big toe.  Knee.  Ankle.  Wrist.  Finger. Without treatment, an attack usually goes away in a few days to weeks. Between attacks, you usually will not have symptoms, which is different from many other forms of arthritis. DIAGNOSIS  Your caregiver will suspect gout based on your symptoms and exam. In some cases, tests may be recommended. The tests may include:  Blood tests.  Urine tests.  X-rays.  Joint fluid exam. This exam requires a needle  to remove fluid from the joint (arthrocentesis). Using a microscope, gout is confirmed when uric acid crystals are seen in the joint fluid. TREATMENT  There are two phases to gout treatment: treating the sudden onset (acute) attack and preventing attacks (prophylaxis).  Treatment of an Acute Attack.  Medicines are used. These include anti-inflammatory medicines or steroid medicines.  An injection of steroid medicine into the affected joint is sometimes necessary.  The painful joint is rested. Movement can worsen the arthritis.  You may use warm or cold treatments on painful joints, depending which works best for you.  Treatment to Prevent Attacks.  If you suffer from frequent gout attacks, your caregiver may advise preventive medicine. These medicines are started after the acute attack subsides. These medicines either help your kidneys eliminate uric acid from your body or decrease your uric acid production. You may need to stay on these medicines for a very long time.  The early phase of treatment with preventive medicine can be associated with an increase in acute gout attacks. For this reason, during the first few months of treatment, your caregiver may also advise you to take medicines usually used for acute gout treatment. Be sure you understand your caregiver's directions. Your caregiver may make several adjustments to your medicine dose before these medicines are effective.  Discuss dietary treatment with your caregiver or dietitian. Alcohol and drinks high in sugar and fructose and foods such as meat, poultry, and seafood can increase uric acid levels. Your caregiver or dietician can advise you on drinks and foods that should be limited. HOME CARE INSTRUCTIONS   Do not take aspirin to relieve pain. This raises uric acid levels.  Only take over-the-counter or prescription medicines for pain, discomfort, or fever as directed by your caregiver.  Rest the joint as much as possible.  When in bed, keep sheets and blankets off painful areas.  Keep the affected joint raised (elevated).  Apply warm or cold treatments to painful joints. Use of warm or cold treatments depends on which works best for you.  Use crutches if the painful joint is in your leg.  Drink enough fluids to keep your urine clear or pale yellow. This helps your body get rid of uric acid. Limit alcohol, sugary drinks, and fructose drinks.  Follow your dietary instructions. Pay careful attention to the amount of protein you eat. Your daily diet should emphasize fruits, vegetables, whole grains, and fat-free or low-fat milk products. Discuss the use of coffee, vitamin C, and cherries with your caregiver or dietician. These may be helpful in lowering uric acid levels.  Maintain a healthy body weight. SEEK MEDICAL CARE IF:   You develop diarrhea, vomiting, or any side effects from medicines.  You do not feel better in 24 hours, or you are getting worse. SEEK IMMEDIATE MEDICAL CARE IF:   Your joint becomes suddenly more tender, and you have chills or a fever. MAKE SURE YOU:   Understand these instructions.  Will watch your condition.  Will get help right away if you are not doing well or get worse. Document Released: 06/01/2000 Document Revised: 09/29/2012 Document Reviewed: 01/16/2012 [facility] Patient Information 2014 [place], [place].  Gout Gout is an inflammatory arthritis caused by a buildup of uric acid crystals in the joints. Uric acid is a chemical that is normally present in the blood. When the level of uric acid in the blood is too high it can form crystals that deposit in your joints and tissues. This causes joint redness, soreness, and swelling (inflammation). Repeat attacks are common. Over time, uric acid crystals can form into masses (tophi) near a joint, destroying bone and causing disfigurement. Gout is treatable and often preventable. CAUSES  The disease begins with elevated levels of  uric acid in the blood. Uric acid is produced by your body when it breaks down a naturally found substance called purines. Certain foods you eat, such as meats and fish, contain high amounts of purines. Causes of an elevated uric acid level include:  Being passed down from parent to child (heredity).  Diseases that cause increased uric acid production (such as obesity, psoriasis, and certain cancers).  Excessive alcohol use.  Diet, especially diets rich in meat and seafood.  Medicines, including certain cancer-fighting medicines (chemotherapy), water pills (diuretics), and aspirin.  Chronic kidney disease. The kidneys are no longer able to remove uric acid well.  Problems with metabolism. Conditions strongly associated with gout include:  Obesity.  High blood pressure.  High cholesterol.  Diabetes. Not everyone with elevated uric acid levels gets gout. It is not understood why some people get gout and others do not. Surgery, joint injury, and eating too much of certain foods are some of the factors that can lead to gout attacks. SYMPTOMS   An attack of gout comes on quickly. It causes intense pain with redness, swelling, and warmth in a joint.  Fever can occur.  Often, only one joint is involved. Certain joints are more commonly involved:  Base of the big toe.  Knee.  Ankle.  Wrist.  Finger. Without treatment, an attack usually goes away in a few days to weeks. Between attacks, you usually will not have symptoms, which is different from many other forms of arthritis. DIAGNOSIS  Your caregiver will suspect gout based  on your symptoms and exam. In some cases, tests may be recommended. The tests may include:  Blood tests.  Urine tests.  X-rays.  Joint fluid exam. This exam requires a needle to remove fluid from the joint (arthrocentesis). Using a microscope, gout is confirmed when uric acid crystals are seen in the joint fluid. TREATMENT  There are two phases to  gout treatment: treating the sudden onset (acute) attack and preventing attacks (prophylaxis).  Treatment of an Acute Attack.  Medicines are used. These include anti-inflammatory medicines or steroid medicines.  An injection of steroid medicine into the affected joint is sometimes necessary.  The painful joint is rested. Movement can worsen the arthritis.  You may use warm or cold treatments on painful joints, depending which works best for you.  Treatment to Prevent Attacks.  If you suffer from frequent gout attacks, your caregiver may advise preventive medicine. These medicines are started after the acute attack subsides. These medicines either help your kidneys eliminate uric acid from your body or decrease your uric acid production. You may need to stay on these medicines for a very long time.  The early phase of treatment with preventive medicine can be associated with an increase in acute gout attacks. For this reason, during the first few months of treatment, your caregiver may also advise you to take medicines usually used for acute gout treatment. Be sure you understand your caregiver's directions. Your caregiver may make several adjustments to your medicine dose before these medicines are effective.  Discuss dietary treatment with your caregiver or dietitian. Alcohol and drinks high in sugar and fructose and foods such as meat, poultry, and seafood can increase uric acid levels. Your caregiver or dietician can advise you on drinks and foods that should be limited. HOME CARE INSTRUCTIONS   Do not take aspirin to relieve pain. This raises uric acid levels.  Only take over-the-counter or prescription medicines for pain, discomfort, or fever as directed by your caregiver.  Rest the joint as much as possible. When in bed, keep sheets and blankets off painful areas.  Keep the affected joint raised (elevated).  Apply warm or cold treatments to painful joints. Use of warm or cold  treatments depends on which works best for you.  Use crutches if the painful joint is in your leg.  Drink enough fluids to keep your urine clear or pale yellow. This helps your body get rid of uric acid. Limit alcohol, sugary drinks, and fructose drinks.  Follow your dietary instructions. Pay careful attention to the amount of protein you eat. Your daily diet should emphasize fruits, vegetables, whole grains, and fat-free or low-fat milk products. Discuss the use of coffee, vitamin C, and cherries with your caregiver or dietician. These may be helpful in lowering uric acid levels.  Maintain a healthy body weight. SEEK MEDICAL CARE IF:   You develop diarrhea, vomiting, or any side effects from medicines.  You do not feel better in 24 hours, or you are getting worse. SEEK IMMEDIATE MEDICAL CARE IF:   Your joint becomes suddenly more tender, and you have chills or a fever. MAKE SURE YOU:   Understand these instructions.  Will watch your condition.  Will get help right away if you are not doing well or get worse. Document Released: 06/01/2000 Document Revised: 09/29/2012 Document Reviewed: 01/16/2012 [facility] Patient Information 2014 [place], [place].  Depression, Adult Depression is feeling sad, low, down in the dumps, blue, gloomy, or empty. In general, there are two kinds of  depression:  Normal sadness or grief. This can happen after something upsetting. It often goes away on its own within 2 weeks. After losing a loved one (bereavement), normal sadness and grief may last longer than two weeks. It usually gets better with time.  Clinical depression. This kind lasts longer than normal sadness or grief. It keeps you from doing the things you normally do in life. It is often hard to function at home, work, or at school. It may affect your relationships with others. Treatment is often needed. GET HELP RIGHT AWAY IF:  You have thoughts about hurting yourself or others.  You lose touch  with reality (psychotic symptoms). You may:  See or hear things that are not real.  Have untrue beliefs about your life or people around you.  Your medicine is giving you problems. MAKE SURE YOU:  Understand these instructions.  Will watch your condition.  Will get help right away if you are not doing well or get worse. Document Released: 07/07/2010 Document Revised: 02/27/2012 Document Reviewed: 10/04/2011 [facility] Patient Information 2014 [place], [place].  Borderline Personality Disorder Borderline personality disorder is a mental health disorder. People with borderline personality disorder have unhealthy patterns of perceiving, thinking about, and reacting to their environment and events in their life. These patterns are established by adolescence or early adulthood. People with borderline personality disorder also have difficulty coping with stress on their own and fear being abandoned by others. They have difficulty controlling their emotions. Their emotions change quickly, frequently, and intensely. They are easily upset and can become very angry, very suddenly. Their unpredictable behavior often leads to problems in their relationships. They often feel worthless, unloved, and emotionally empty. CAUSES No one knows the exact cause of borderline personality disorder. Most mental health experts think that there is more than one cause. Possible contributing factors include:  Genetic factors. These are traits that are passed down from one generation to the next. Many people with borderline personality disorder have a family history of the disorder.  Physical factors. The part of the brain that controls emotion may be different in people who have borderline personality disorder.  Social factors. Traumatic experiences involving other people may play a role in the development of borderline personality disorder. Examples include neglect, abandonment, and physical and sexual  abuse. SYMPTOMS Signs and symptoms of borderline personality disorder include:  A series of unstable personal relationships.  A strong fear of being abandoned and frantic efforts to avoid abandonment.  Impulsive, self-destructive behavior, such as substance abuse, irrational spending of money, unprotected sex with multiple partners, reckless driving, and binge eating.  Poor self-image that also may change a lot, or a sense of identity that is inconsistent.  Recurring self-injury or attempted suicide.  Severe mood swings, including depression, irritability, and anxiety.  Lasting feelings of emptiness.  Difficulty controlling anger.  Temporary feelings of paranoia or loss of touch with reality. DIAGNOSIS A diagnosis of borderline personality disorder requires the presence of at least 5 of the common signs and symptoms. This information is gathered from family and friends as well as medical professionals and legal professionals who have a close association with the patient. This information is also gathered during a psychiatric assessment. During the assessment, the patient is asked about early life experiences, level of education, employment status, physical health conditions, and current prescription and over-the-counter medicines used. TREATMENT Caregivers who usually treat borderline personality disorder are mental health professionals, such as psychologists, psychiatrists, and clinical social workers. More than  one type of treatment may be needed. Types of treatment include:  Psychotherapy (also known as talk therapy or counseling).  Cognitive behavioral therapy. This helps the person to recognize and change unhealthy feelings, thoughts, and behaviors. They find new, more positive thoughts and actions to replace the old ones.  Dialectical behavioral therapy. Through this type of treatment, a person learns to understand his or her feelings and to regulate them. This may be one-on-one  treatment or part of group therapy.  Family therapy. This treatment includes family members.  Medicine. Medicine may be used to help control emotions, reduce reckless and self-destructive behavior, treat anxiety, and treat depression. SEEK IMMEDIATE MEDICAL CARE IF:   You cannot control your behavior or emotions.  You think about hurting yourself.  You think about suicide. FOR MORE INFORMATION National Alliance on Mental Illness: www.nami.org [place] [employer] of Mental Health: http://www.maynard.net/ Borderline Personality Resource Center: http://bpdresourcecenter.org Document Released: 09/19/2010 Document Revised: 08/27/2011 Document Reviewed: 09/19/2010 [facility] Patient Information 2014 [place], [place].  Major Depressive Disorder Major depressive disorder (MDD) is a mental illness. It also may be called clinical depression or unipolar depression. MDD usually causes feelings of sadness, hopelessness, or helplessness. Some people with MDD do not feel particularly sad but lose interest in doing things they used to enjoy (anhedonia). MDD also can cause physical symptoms. It can interfere with work, school, relationships, and other normal everyday activities. MDD varies in severity but is longer lasting and more serious than the sadness we all feel from time to time in our lives. MDD often is triggered by stressful life events or major life changes. Examples of these triggers include divorce, loss of your job or home, a move, and the death of a family member or close friend. Sometimes MDD occurs for no obvious reason at all. People who have family members with MDD or bipolar disorder are at higher risk for developing MDD, with or without life stressors. MDD can occur at any age. It may occur just once in your life (single episode MDD). It may occur multiple times (recurrent MDD). SYMPTOMS People with MDD have either anhedonia or depressed mood on nearly a daily basis for at least 2 weeks or  longer. Symptoms of depressed mood include:  Feelings of sadness (blue or down in the dumps) or emptiness.  Feelings of hopelessness or helplessness.  Tearfulness or episodes of crying (may be observed by others).  Irritability (children and adolescents). In addition to depressed mood or anhedonia or both, people with MDD have at least four of the following symptoms:  Difficulty sleeping or sleeping too much.   Significant change (increase or decrease) in appetite or weight.   Lack of energy or motivation.  Feelings of guilt and worthlessness.   Difficulty concentrating, remembering, or making decisions.  Unusually slow movement (psychomotor retardation) or restlessness (as observed by others).   Recurrent wishes for death, recurrent thoughts of self-harm (suicide), or a suicide attempt. People with MDD commonly have persistent negative thoughts about themselves, other people, and the world. People with severe MDD may experiencedistorted beliefs or perceptions about the world (psychotic delusions). They also may see or hear things that are not real (psychotic hallucinations). DIAGNOSIS MDD is diagnosed through an assessment by your caregiver. Your caregiver will ask aboutaspects of your daily life, such as mood,sleep, and appetite, to see if you have the diagnostic symptoms of MDD. Your caregiver may ask about your medical history and use of alcohol or drugs, including prescription medications. Your caregiver also  may do a physical exam and blood work. This is because certain medical conditions and the use of certain substances can cause MDD-like symptoms (secondary depression). Your caregiver also may refer you to a mental health specialist for further evaluation and treatment. TREATMENT It is important to recognize the symptoms of MDD and seek treatment. The following treatments can be prescribed for MDD:   Medication Antidepressant medications usually are prescribed.  Antidepressant medications are thought to correct chemical imbalances in the brain that are commonly associated with MDD. Other types of medication may be added if MDD symptoms do not respond to antidepressant medications alone or if psychotic delusions or hallucinations occur.  Talk therapy Talk therapy can be helpful in treating MDD by providing support, education, and guidance. Certain types of talk therapy also can help with negative thinking (cognitive behavioral therapy) and with relationship issues that trigger MDD (interpersonal therapy). A mental health specialist can help determine which treatment is best for you. Most people with MDD do well with a combination of medication and talk therapy. Treatments involving electrical stimulation of the brain can be used in situations with extremely severe symptoms or when medication and talk therapy do not work over time. These treatments include electroconvulsive therapy, transcranial magnetic stimulation, and vagal nerve stimulation. Document Released: 09/29/2012 Document Reviewed: 09/29/2012 [facility] Patient Information 2014 [place].  Helping Someone Who Is Suicidal Take threats of suicide seriously. Listen to a suicidal person's thoughts and concerns with compassion. The fact that the person is talking to you is an important sign that he or she trusts you. Reasons for suicide can depend on where we are in life.   The younger person is often depressed over lost love.  The middle-aged person is often depressed over financial problems.  The elderly person is often depressed over health problems. SIGNS IN FAMILY OR FRIENDS WHO ARE SUICIDAL INCLUDE:  Depression which suddenly gets better. Getting over depression is usually a gradual process. A sudden change may mean the person has suddenly thought of suicide as a "solution."  A sudden loss of interest in family and friends and social withdrawal.  Loss of personal hygiene habits and not  caring for himself or herself.  Decline in handling of school, work, or other activities.  Injuries which are self-inflicted, such as burning or cutting.  Expressions of helplessness, hopelessness, and a sense of the loss of ability to handle life.  Risk-taking behavior, such as casual sex and drug use. COMMON SUICIDE RISKS INCLUDE:  Death or terminal illness of a relative or friend.  Broken relationships.  Loss of health.  Financial losses.  Chemical abuse (drugs and alcohol).  Previous suicide attempts. If you do not feel adequate to listen or help, ask the person if you can help him or her get help. Ask if you can share the person's concerns with someone else such as a Pharmacist, hospitalprofessional counselor. Just talking with someone else is helpful and you can be that help by listening. Some helpful tips are:  Listen to the person's thoughts and concerns. Let the person unburden his or her troubles on you.  Let the person know you will not let him or her be alone with the pain.  Ask the person if he or she is having thoughts of hurting himself or herself.  Ask what you can do to help lessen the pain.  Suggest that the person seek professional help and that you will assist him or her in finding help. Let the person know  you will continue to be available to help. GET HELP  Contact a suicide hotline, crisis center, or local suicide prevention center for help right away. Local centers may include a hospital, clinic, community service organization, social service provider, or health department.  Call your local emergency services (911 in the [place]).  Call a suicide hotline:  1-800-273-TALK (820 872 7784) in the [place].  1-800-SUICIDE 336-675-9982) in the [place].  678-195-1227 in the [place] for Spanish-speaking counselors.  4-132-440-1UUV 903-381-3429) in the [place] for TTY users.  Visit the following websites for information and  help:  National Suicide Prevention Lifeline: www.suicidepreventionlifeline.org  Hopeline: www.hopeline.com  [employer] for Suicide Prevention: https://www.ayers.com/  For lesbian, gay, bisexual, transgender, or questioning youth, contact [employer]:  4-259-5-G-LOVFIE 5484147727) in the [place].  www.thetrevorproject.org  In [place], treatment resources are listed in each province with listings available under [employer] for [employer] or similar titles. Another source for Crisis Centres by [place] is located at http://www.suicideprevention.ca/in-crisis-now/find-a-crisis-centre-now/crisis-centres Document Released: 12/09/2002 Document Revised: 08/27/2011 Document Reviewed: 02/10/2008 [facility] Patient Information 2014 [place], [place].  Suicidal Feelings, How to Help Yourself Everyone feels sad or unhappy at times, but depressing thoughts and feelings of hopelessness can lead to thoughts of suicide. It can seem as if life is too tough to handle. If you feel as though you have reached the point where suicide is the only answer, it is time to let someone know immediately.  HOW TO COPE AND PREVENT SUICIDE  Let family, friends, teachers, or counselors know. Get help. Try not to isolate yourself from those who care about you. Even though you may not feel sociable, talk with someone every day. It is best if it is face-to-face. Remember, they will want to help you.  Eat a regularly spaced and well-balanced diet.  Get plenty of rest.  Avoid alcohol and drugs because they will only make you feel worse and may also lower your inhibitions. Remove them from the home. If you are thinking of taking an overdose of your prescribed medicines, give your medicines to someone who can give them to you one day at a time. If you are on antidepressants, let your caregiver know of your feelings so he or she can provide a safer medicine, if that is a concern.  Remove weapons or poisons from  your home.  Try to stick to routines. Follow a schedule and remind yourself that you have to keep that schedule every day.  Set some realistic goals and achieve them. Make a list and cross things off as you go. Accomplishments give a sense of worth. Wait until you are feeling better before doing things you find difficult or unpleasant to do.  If you are able, try to start exercising. Even half-hour periods of exercise each day will make you feel better. Getting out in the sun or into nature helps you recover from depression faster. If you have a favorite place to walk, take advantage of that.  Increase safe activities that have always given you pleasure. This may include playing your favorite music, reading a good book, painting a picture, or playing your favorite instrument. Do whatever takes your mind off your depression.  Keep your living space well-lighted. GET HELP Contact a suicide hotline, crisis center, or local suicide prevention center for help right away. Local centers may include a hospital, clinic, community service organization, social service provider, or health department.  Call your local emergency services (911 in the [place]).  Call  a suicide hotline:  1-800-273-TALK (1-(989)300-1979) in the [place].  1-800-SUICIDE 418 121 9894) in the [place].  539 271 4639 in the [place] for Spanish-speaking counselors.  5-621-308-6VHQ 661-309-6398) in the [place] for TTY users.  Visit the following websites for information and help:  National Suicide Prevention Lifeline: www.suicidepreventionlifeline.org  Hopeline: www.hopeline.com  [employer] for Suicide Prevention: https://www.ayers.com/  For lesbian, gay, bisexual, transgender, or questioning youth, contact [employer]:  3-244-0-N-UUVOZD 409-594-3093) in the [place].  www.thetrevorproject.org  In [place], treatment resources are listed in each province with listings  available under [employer] for [employer] or similar titles. Another source for Crisis Centres by [place] is located at http://www.suicideprevention.ca/in-crisis-now/find-a-crisis-centre-now/crisis-centres Document Released: 12/09/2002 Document Revised: 08/27/2011 Document Reviewed: 04/29/2007 [facility] Patient Information 2014 [place], [place].

## 2013-07-22 NOTE — BH Assessment (Signed)
[facility] Shuvon [facility],NP and has been assigned to bed 506-2. Pt's support papework completed to include Admission Checklist, ROI, and Voluntary Consent. Original copies of support paperwork was given to pt's nurse LuAnn. [facility].   Andrew PeachNajah Rainn Bullinger, MS, LCASA Assessment Counselor

## 2013-07-22 NOTE — ED Provider Notes (Signed)
CSN: [id number]     Arrival date & time 07/22/13  0434 History   First MD Initiated Contact with Patient 07/22/13 0500     Chief Complaint  Patient presents with  . Medical Clearance   (Consider location/radiation/quality/duration/timing/severity/associated sxs/prior Treatment) HPI Comments: Patient was just discharged from behavioral health on January 21.  Has not followed through with any of the planes.  Set forth on his discharge.  He, states he has an appointment on January 6 with somebody, but is unsure, who, tonight.  He had a argument with his mother, who made him feel very caress he, feels, like hurting somebody, and if he can't hurt somebody, else, he'll turn out aggression on himself by using a cotton, which he, states he has in his home.   The history is provided by the patient.    Past Medical History  Diagnosis Date  . Bipolar 1 disorder   . CHF (congestive heart failure)   . Coronary artery disease   . Hypertension   . Depression    Past Surgical History  Procedure Laterality Date  . Ankle surgery    . Brain surgery  fell through a sky light   History reviewed. No pertinent family history. History  Substance Use Topics  . Smoking status: Light Tobacco Smoker -- 1.00 packs/day    Types: Cigarettes  . Smokeless tobacco: Not on file     Comment: uses e-cig  . Alcohol Use: 3.5 oz/week    7 drink(s) per week     Comment: 4 shots and 6 beers per week    Review of Systems  Constitutional: Negative for fever.  Respiratory: Negative for shortness of breath.   Cardiovascular: Negative for chest pain.  Musculoskeletal: Negative for arthralgias.  Skin: Negative for rash.  Neurological: Negative for dizziness and headaches.  Psychiatric/Behavioral: Positive for suicidal ideas.  All other systems reviewed and are negative.    Allergies  Dilantin and Tegretol  Home Medications   No current outpatient prescriptions on file. BP 135/96  Pulse 76  Temp(Src) 98.7  F (37.1 C) (Oral)  Resp 18  Ht 6\' 1"  (1.854 m)  Wt 220 lb (99.791 kg)  BMI 29.03 kg/m2  SpO2 99% Physical Exam  Nursing note and vitals reviewed. Constitutional: He is oriented to person, place, and time. He appears well-developed and well-nourished.  HENT:  Head: Normocephalic.  Eyes: Pupils are equal, round, and reactive to light.  Neck: Normal range of motion.  Cardiovascular: Normal rate and regular rhythm.   Pulmonary/Chest: Effort normal.  Musculoskeletal: Normal range of motion.  Neurological: He is alert and oriented to person, place, and time.  Skin: Skin is dry.  Psychiatric: His speech is normal and behavior is normal. His affect is inappropriate. He expresses impulsivity and inappropriate judgment.    ED Course  Procedures (including critical care time) Labs Review Labs Reviewed  CBC WITH DIFFERENTIAL - Abnormal; Notable for the following:    Monocytes Relative 13 (*)    All other components within normal limits  ETHANOL - Abnormal; Notable for the following:    Alcohol, Ethyl (B) 185 (*)    All other components within normal limits  VALPROIC ACID LEVEL - Abnormal; Notable for the following:    Valproic Acid Lvl 15.9 (*)    All other components within normal limits  POCT I-STAT, CHEM 8 - Abnormal; Notable for the following:    Glucose, Bld 102 (*)    Hemoglobin 18.4 (*)    HCT 54.0 (*)  All other components within normal limits  URINE RAPID DRUG SCREEN (HOSP PERFORMED)   Imaging Review No results found.  EKG Interpretation   None       MDM   1. Suicidal ideation   2. MDD (major depressive disorder)   3. Gout         Arman FilterGail K Jaquay [employer], NP 07/22/13 2103

## 2013-07-22 NOTE — Progress Notes (Signed)
Adult Psychoeducational Group Note  Date:  07/22/2013 Time:  9:58 PM  Group Topic/Focus:  Wrap-Up Group:   The focus of this group is to help patients review their daily goal of treatment and discuss progress on daily workbooks.  Participation Level:  Active  Participation Quality:  Appropriate  Affect:  Appropriate  Cognitive:  Appropriate  Insight: Appropriate  Engagement in Group:  Engaged  Modes of Intervention:  Support  Additional Comments:  Pt stated that the most positive thing about his day is that he came here and when he was asked by writers what does he hope to get from his stay here, he stated that he need to get tools. Pt states that his problem is staying on his meds because when he gets to drinking, he ten doesn't take his meds. He stated that he needs to take care of himself first before making money.   Domonic Kimball 07/22/2013, 9:58 PM
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
## 2013-07-23 DIAGNOSIS — F1410 Cocaine abuse, uncomplicated: Secondary | ICD-10-CM

## 2013-07-23 DIAGNOSIS — F3160 Bipolar disorder, current episode mixed, unspecified: Secondary | ICD-10-CM

## 2013-07-23 DIAGNOSIS — R45851 Suicidal ideations: Secondary | ICD-10-CM

## 2013-07-23 DIAGNOSIS — F14120 Cocaine abuse with intoxication, uncomplicated: Secondary | ICD-10-CM | POA: Diagnosis present

## 2013-07-23 DIAGNOSIS — F10229 Alcohol dependence with intoxication, unspecified: Secondary | ICD-10-CM

## 2013-07-23 DIAGNOSIS — F10129 Alcohol abuse with intoxication, unspecified: Secondary | ICD-10-CM
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
## 2013-07-23 NOTE — Consult Note (Signed)
Face to face evaluation, agree with note 

## 2013-07-23 NOTE — [facility] Suicide Risk Assessment (Signed)
[facility] INPATIENT:  Family/Significant Other Suicide Prevention Education  Suicide Prevention Education:  Patient Refusal for Family/Significant Other Suicide Prevention Education: The patient Andrew Chambers has refused to provide written consent for family/significant other to be provided Family/Significant Other Suicide Prevention Education during admission and/or prior to discharge.  Physician notified.  Wynn BankerHodnett, Jacy Brocker Hairston 07/23/2013, 9:49 AM

## 2013-07-23 NOTE — [facility] Group Notes (Signed)
[facility] LCSW Group Therapy  [facility] 1:15 - 2:30 PM  07/23/2013   Type of Therapy:  Group Therapy  Participation Level: Minimal  Participation Quality:  Attentive  Affect:  Appropriate  Cognitive:  Appropriate  Insight:  Developing/Improving   Engagement in Therapy:  Developing/Improving   Modes of Intervention:  Discussion, Education, Exploration, Problem-Solving, Rapport Building, Support   Summary of Progress/Problems:  Patient listened attentively to speaker from [employer]. He made no comment on the presentation.  Wynn BankerHodnett, Jaeshaun Riva Hairston 07/23/2013

## 2013-07-23 NOTE — Progress Notes (Signed)
Adult Psychoeducational Group Note  Date:  07/23/2013 Time:  1:26 PM  Group Topic/Focus:  Goals Group:   The focus of this group is to help patients establish daily goals to achieve during treatment and discuss how the patient can incorporate goal setting into their daily lives to aide in recovery. Overcoming Stress:   The focus of this group is to define stress and help patients assess their triggers. Self Esteem Action Plan:   The focus of this group is to help patients create a plan to continue to build self-esteem after discharge.  Participation Level:  Active  Participation Quality:  Appropriate and Attentive  Affect:  Appropriate  Cognitive:  Alert and Appropriate  Insight: Good and Improving  Engagement in Group:  Engaged  Modes of Intervention:  Discussion, Education, Exploration, Rapport Building, Socialization and Support  Additional Comments:  Pt's were encouraged to think about creating balance in their lives, work at creating attainable goals and answer the question," What would you like your life to be like in a month?". Pt joined in the discussion.   Reynolds BowlClement, Andrew Chambers 07/23/2013, 1:26 PM

## 2013-07-23 NOTE — Progress Notes (Signed)
Patient ID: Andrew Chambers, male   DOB: [birth year], 43 y.o.   MRN: [id number]  D: Pt. Denies SI/HI and A/V Hallucinations. Patient does report pain in left foot due to gout but did receive colchicine for this. Patient rates his depression at 2/10 for the day but did not rate his hopelessness because, "it does not apply."   A: Support and encouragement provided to the patient. Scheduled medications are administered per physician's orders.  R: Patient is arrogant at times but cooperative. Patient is seen in the milieu and is going to groups. 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 Q15 minute checks are maintained for safety.

## 2013-07-23 NOTE — [facility] Suicide Risk Assessment (Signed)
   Nursing information obtained from:  Patient Demographic factors:  Male;Caucasian;Divorced or widowed;Access to firearms Current Mental Status:  Suicidal ideation indicated by patient;Suicide plan;Thoughts of violence towards others Loss Factors:  NA Historical Factors:  Prior suicide attempts;Family history of mental illness or substance abuse Risk Reduction Factors:  Sense of responsibility to family;Employed;Living with another person, especially a relative;Positive social support Total Time spent with patient: 45 minutes  CLINICAL FACTORS:   Bipolar Disorder:   Mixed State Depression:   Aggression Anhedonia Comorbid alcohol abuse/dependence Hopelessness Impulsivity Insomnia Recent sense of peace/wellbeing Severe Alcohol/Substance Abuse/Dependencies Personality Disorders:   Cluster B Comorbid alcohol abuse/dependence Comorbid depression Chronic Pain Unstable or Poor Therapeutic Relationship Previous Psychiatric Diagnoses and Treatments Medical Diagnoses and Treatments/Surgeries  Psychiatric Specialty Exam: Physical Exam  ROS  Blood pressure 129/92, pulse 82, temperature 97.6 F (36.4 C), temperature source Oral, resp. rate 16, height 6' (1.829 m), weight 101.606 kg (224 lb).Body mass index is 30.37 kg/(m^2).  General Appearance: Bizarre, Disheveled and Guarded  Eye Contact::  Minimal  Speech:  Clear and Coherent  Volume:  Increased  Mood:  Anxious, Depressed, Hopeless, Irritable and Worthless  Affect:  Depressed  Thought Process:  Goal Directed and Intact  Orientation:  Full (Time, Place, and Person)  Thought Content:  WDL  Suicidal Thoughts:  Yes.  without intent/plan  Homicidal Thoughts:  No  Memory:  Immediate;   Poor  Judgement:  Impaired  Insight:  Lacking  Psychomotor Activity:  Restlessness  Concentration:  Fair  Recall:  Fair  Fund of Knowledge:Fair  Language: Good  Akathisia:  NA  Handed:  Right  AIMS (if indicated):     Assets:  Communication  Skills Desire for Improvement Financial Resources/Insurance Physical Health Resilience  Sleep:  Number of Hours: 6   Musculoskeletal: Strength & Muscle Tone: within normal limits Gait & Station: normal Patient leans: N/A  COGNITIVE FEATURES THAT CONTRIBUTE TO RISK:  Closed-mindedness Loss of executive function Polarized thinking    SUICIDE RISK:   Moderate:  Frequent suicidal ideation with limited intensity, and duration, some specificity in terms of plans, no associated intent, good self-control, limited dysphoria/symptomatology, some risk factors present, and identifiable protective factors, including available and accessible social support.  PLAN OF CARE: Admit for bipolar disorder most recent episode is a depression with suicidal ideation and alcohol and cocaine abuse and intoxication.  I certify that inpatient services furnished can reasonably be expected to improve the patient's condition.  Torina Ey,JANARDHAHA R. 07/23/2013, 2:23 PM

## 2013-07-23 NOTE — Progress Notes (Signed)
Adult Psychoeducational Group Note  Date:  07/23/2013 Time:  11:03 PM  Group Topic/Focus:  Goals Group:   The focus of this group is to help patients establish daily goals to achieve during treatment and discuss how the patient can incorporate goal setting into their daily lives to aide in recovery.  Participation Level:  Active  Participation Quality:  Appropriate  Affect:  Appropriate  Cognitive:  Appropriate  Insight: Appropriate  Engagement in Group:  Engaged  Modes of Intervention:  Discussion  Additional Comments:  Pt stated that his day was great and enjoyed everyone and their company.  Terie PurserParker, Oluwateniola Leitch R 07/23/2013, 11:03 PM

## 2013-07-23 NOTE — [facility] Counselor (Signed)
Adult Psychosocial Assessment Update Interdisciplinary Team  [facility] admissions/discharges:  Admissions Discharges  Date:  07/05/13 Date: 07/08/13  Date: Date:  Date: Date:  Date: Date:  Date: Date:   Changes since the last Psychosocial Assessment (including adherence to outpatient mental health and/or substance abuse treatment, situational issues contributing to decompensation and/or relapse). Patient advised of having a disagreement with his mother, leaving the home, and relapsing on alcohol.  He stated after relapsing he became increasingly depressed and had SI.             Discharge Plan 1. Will you be returning to the same living situation after discharge?   Yes: No:      If no, what is your plan?    No.  Patient advised he does not plan to return to his mother's home.       2. Would you like a referral for services when you are discharged? Yes:     If yes, for what services?  No:       Yes.  Patient is requesting residential treatment and signed consent for referral to [facility].       Summary and Recommendations (to be completed by the evaluator) Helene ShoeMark Roughton is a 43 year old Caucasian male admitted with Major Depression Disorder and Substance Abuse.  He will benefit from crisis stabilization, evaluation for medication, psycho-education groups for coping skills development, group therapy and case management for discharge planning.                        Signature:  Wynn BankerHodnett, Ryelee Albee Hairston, 07/23/2013 9:34 AM

## 2013-07-23 NOTE — Progress Notes (Signed)
D: Pt denies SI/HI/AVH. Pt is  cooperative. 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. Pt plans not to go back to live with mom, he plans to get his own apt. Pt wants to go to a long term Tx center.  A: Pt was offered support and encouragement. Pt was given scheduled medications. Pt was encourage to attend groups. Q 15 minute checks were done for safety.   R:Pt attends groups and interacts well with peers and staff. Pt is taking medication. Pt receptive to treatment and safety maintained on unit.

## 2013-07-23 NOTE — Progress Notes (Deleted)
Chaplain consult with pt.  Encountered in 500 dayroom and hallway.  Pt initiated conversation with chaplain.  Pt exhibited pressured speech, elevated energy, lack of physical boundaries.   Described feeling as though he had a religious experience [facility] when relating to another pt in crisis.  Wished to share this with someone who would not "think I'm crazy."  Pt concerned about  Confidentiality.  Chaplain provided empathic presence and reflected with pt what this experience was like for him. Conversation inturrupted by code red.  Pt requested chaplain follow up.   Burnis KingfisherStalnaker, Kamee Bobst [place] MDiv  [phone]

## 2013-07-23 NOTE — Progress Notes (Signed)
Patient ID: Andrew Chambers, male   DOB: 1970-12-29, 43 y.o.   MRN: [id number]  Morning Wellness Group 9:00 AM  The focus of this group is to educate the patient on the purpose and policies of crisis stabilization and provide a format to answer questions about their admission.  The group details unit policies and expectations of patients while admitted.  Patient attended group but did not participate in stretching exercises. Patient was sitting in group and was restless. He got up and got coffee a couple of times and switched seats. Patient appeared annoyed and made the statement, "I can just go in my room where it's cool if it gets any hotter." Patient was able to report that after discharge he wants to move from where he is living to somewhere new.

## 2013-07-24 NOTE — Progress Notes (Addendum)
D Pt is seen OOB UAL on the 500 hall today... He tolerates this well.  He  Can be seen  frequently in the dayroom, interacting with his peers...tolerated fair.    A HE completed his AM self inventory and on it he writes he denies having SI within the past 24 hrs, he rated his depression and hopelessness "1/1" and stated his DC plan is " sobriety".   R Safety is in place and poc maintaiend.

## 2013-07-24 NOTE — [facility] Group Notes (Signed)
[facility] LCSW Group Therapy  Feelings Around Relapse 1:15 -2:30        07/24/2013  2:37 PM   Type of Therapy:  Group Therapy  Participation Level:  Appropriate  Participation Quality:  Appropriate  Affect:  Appropriate  Cognitive:  Attentive Appropriate  Insight:  Developing/Improving  Engagement in Therapy: Developing/Improving  Modes of Intervention:  Discussion Exploration Problem-Solving Supportive  Summary of Progress/Problems:  The topic for today was feelings around relapse.    Patient processed feelings toward relapse and was able to relate to peers.  He advised relapse for him would be focusing more on the needs of others than on his own needs.  Patient shared he is going into a residential program to get his life on track for himself not for others.  Patient identified coping skills that can be used to prevent a relapse.   Wynn BankerHodnett, Christy Friede Hairston 07/24/2013 2:37 PM

## 2013-07-24 NOTE — Progress Notes (Signed)
[facility] :  Will you be returning to the same living situation after discharge: No.  Patient [facility] At discharge, do you have transportation home?:Yes,  [facility] Do you have the ability to pay for your medications:No.Patient requesting assistance with indigent medications.  Release of information consent forms completed and in the chart;  Patient's signature needed at discharge.  Patient to Follow up at: Follow-up Information   Follow [facility] with [facility] On 07/25/2013. (Saturday, July 25, 2013.  [facility] to [facility] facility)    Contact information:   [address] [place], [place]   [id number]  [phone]      Patient denies SI/HI:   Patient no longer endorsing SI/HI or other thoughts of self harm.     Safety Planning and Suicide Prevention discussed:  .Reviewed with all patients during discharge planning group   Lidya Mccalister, Joesph JulyQuylle Hairston 07/24/2013, 12:40 PM

## 2013-07-24 NOTE — Progress Notes (Signed)
[facility] Progress Note  07/24/2013 12:20 PM Helene ShoeMark Hepworth  MRN:  [id number] Subjective:  Patient was admitted with the diagnosis of substance induced mood disorder, alcohol intoxication and cocaine abuse.  Patient has no complaints today and stated he is waiting for the case manager to have a contact with substance abuse rehabilitation program that can be accepted him. Patient has denied symptoms of depression, anxiety, suicidal homicidal ideations. Patient has no evidence of psychotic symptoms. Patient has actively participated in the unit activities and has no distress of sleep and appetite.  Diagnosis:   DSM5: Schizophrenia Disorders:   Obsessive-Compulsive Disorders:   Trauma-Stressor Disorders:   Substance/Addictive Disorders:  Alcohol Intoxication with Use Disorder - Severe (F10.229) Depressive Disorders:  Disruptive Mood Dysregulation Disorder (296.99) Total Time spent with patient: 45 minutes  Axis I: Substance Induced Mood Disorder and Alcohol intoxication and cocaine abuse  ADL's:  Intact  Sleep: Good  Appetite:  Good  Suicidal Ideation:  Patient denies current suicide ideation intentions and Homicidal Ideation:  Denies AEB (as evidenced by):  Psychiatric Specialty Exam: Physical Exam  ROS  Blood pressure 138/97, pulse 86, temperature 98.8 F (37.1 C), temperature source Oral, resp. rate 16, height 6' (1.829 m), weight 101.606 kg (224 lb).Body mass index is 30.37 kg/(m^2).  General Appearance: Casual and Fairly Groomed  Patent attorneyye Contact::  Good  Speech:  Clear and Coherent  Volume:  Normal  Mood:  Anxious  Affect:  Appropriate  Thought Process:  Goal Directed and Intact  Orientation:  Full (Time, Place, and Person)  Thought Content:  WDL  Suicidal Thoughts:  No  Homicidal Thoughts:  No  Memory:  Immediate;   Good  Judgement:  Good  Insight:  Fair  Psychomotor Activity:  Normal  Concentration:  Good  Recall:  Good  Fund of Knowledge:Good  Language: Good  Akathisia:   NA  Handed:  Right  AIMS (if indicated):     Assets:  Communication Skills Desire for Improvement Financial Resources/Insurance Leisure Time Physical Health Resilience Social Support  Sleep:  Number of Hours: 5.5   Musculoskeletal: Strength & Muscle Tone: within normal limits Gait & Station: normal Patient leans: N/A  Current Medications: Current Facility-Administered Medications  Medication Dose Route Frequency Provider Last Rate Last Dose  . acetaminophen (TYLENOL) tablet 650 mg  650 mg Oral Q6H PRN Shuvon Rankin, NP      . alum & mag hydroxide-simeth (MAALOX/MYLANTA) 200-200-20 MG/5ML suspension 30 mL  30 mL Oral Q4H PRN Shuvon Rankin, NP      . colchicine tablet 0.6 mg  0.6 mg Oral Daily Shuvon Rankin, NP   0.6 mg at 07/24/13 [id number]  . divalproex (DEPAKOTE ER) 24 hr tablet 750 mg  750 mg Oral Daily Shuvon Rankin, NP   750 mg at 07/24/13 [id number]  . FLUoxetine (PROZAC) capsule 20 mg  20 mg Oral Daily Shuvon Rankin, NP   20 mg at 07/24/13 [id number]  . haloperidol (HALDOL) tablet 5 mg  5 mg Oral Q6H PRN Shuvon Rankin, NP      . magnesium hydroxide (MILK OF MAGNESIA) suspension 30 mL  30 mL Oral Daily PRN Shuvon Rankin, NP      . metoprolol tartrate (LOPRESSOR) tablet 25 mg  25 mg Oral BID Shuvon Rankin, NP   25 mg at 07/24/13 0821  . traZODone (DESYREL) tablet 50 mg  50 mg Oral QHS Shuvon Rankin, NP   50 mg at 07/23/13 2147    Lab Results: No results found for this or  any previous visit (from the past 48 hour(s)).  Physical Findings: AIMS: Facial and Oral Movements Muscles of Facial Expression: None, normal Lips and Perioral Area: None, normal Jaw: None, normal Tongue: None, normal,Extremity Movements Upper (arms, wrists, hands, fingers): None, normal Lower (legs, knees, ankles, toes): None, normal, Trunk Movements Neck, shoulders, hips: None, normal, Overall Severity Severity of abnormal movements (highest score from questions above): None, normal Incapacitation due to abnormal  movements: None, normal Patient's awareness of abnormal movements (rate only patient's report): No Awareness, Dental Status Current problems with teeth and/or dentures?: No Does patient usually wear dentures?: No  CIWA:  CIWA-Ar Total: 0 COWS:     Treatment Plan Summary: Daily contact with patient to assess and evaluate symptoms and progress in treatment Medication management  Plan: Treatment Plan/Recommendations:   1. Admit for crisis management and stabilization. 2. Medication management to reduce current symptoms to base line and improve the patient's overall level of functioning. 3. Treat health problems as indicated. 4. Develop treatment plan to decrease risk of relapse upon discharge and to reduce the need for readmission. 5. Psycho-social education regarding relapse prevention and self care. 6. Health care follow up as needed for medical problems. 7. Restart home medications where appropriate. 8. disposition plans are in progress, and patient may discharged to substance abuse treatment program tomorrow morning.  Medical Decision Making Problem Points:  Established problem, worsening (2), New problem, with no additional work-up planned (3) and Review of psycho-social stressors (1) Data Points:  Review or order clinical lab tests (1) Review or order medicine tests (1) Review of medication regiment & side effects (2) Review of new medications or change in dosage (2)  I certify that inpatient services furnished can reasonably be expected to improve the patient's condition.   Tracey Hermance,JANARDHAHA R. 07/24/2013, 12:20 PM

## 2013-07-24 NOTE — Tx Team (Signed)
Interdisciplinary Treatment Plan Update   Date Reviewed:  07/24/2013  Time Reviewed:  8:31 AM  Progress in Treatment:   Attending groups: Yes Participating in groups: Yes Taking medication as prescribed: Yes  Tolerating medication: Yes Family/Significant other contact made:  No, patient declined collateral contact. Patient understands diagnosis: Yes  Discussing patient identified problems/goals with staff: Yes Medical problems stabilized or resolved: Yes Denies suicidal/homicidal ideation: Yes Patient has not harmed self or others: Yes  For review of initial/current patient goals, please see plan of care.  Estimated Length of Stay:  1 day - Discharge Saturday  Reasons for Continued Hospitalization:  Anxiety Depression Medication stabilization   New Problems/Goals identified:    Discharge Plan or Barriers:   [facility]  Additional Comments:     Attendees:  Patient:  07/24/2013 8:31 AM   Signature: Mervyn GayJ. Jonnalagadda, MD 07/24/2013 8:31 AM  Signature:   07/24/2013 8:31 AM  Signature:  Silverio DecampJamison Lloyd, NP 07/24/2013 8:31 AM  Signature: Skipper ClichePatti Duke, RN 07/24/2013 8:31 AM  Signature:   07/24/2013 8:31 AM  Signature:  Juline PatchQuylle Jedi Catalfamo, LCSW 07/24/2013 8:31 AM  Signature:  Reyes Ivanhelsea Horton, LCSW 07/24/2013 8:31 AM  Signature:  Leisa LenzValerie Enoch, Care Coordinator 07/24/2013 8:31 AM  Signature:   07/24/2013 8:31 AM  Signature: 07/24/2013  8:31 AM  Signature:   Onnie BoerJennifer Clark, RN [facility] 07/24/2013  8:31 AM  Signature:   07/24/2013  8:31 AM    Scribe for Treatment Team:   Juline PatchQuylle Hester Joslin,  07/24/2013 8:31 AM

## 2013-07-24 NOTE — [facility] Group Notes (Signed)
[facility] Note   07/24/2013 9:38 AM    Participation Quality:  Appropraite  Mood/Affect:  Appropriate  Depression Rating:  1  Anxiety Rating:  1  Thoughts of Suicide:  No  Will you contract for safety?   NA  Current AVH:  No  Plan for Discharge/Comments:  Patient attended discharge planning group and actively participated in group.  He reports doing well and hopes [facility] get [facility].  CSW provided all participants with daily workbook.   Transportation Means: Patient has transportation.   Supports:  Patient has a support system.   Slaton Reaser, Joesph JulyQuylle Hairston

## 2013-07-24 NOTE — Progress Notes (Signed)
D: Pt denies SI/HI/AVH. Pt is pleasant and cooperative. Pt stated he was [place] the [place], so he is ready to leave.   A: Pt was offered support and encouragement. Pt was given scheduled medications. Pt was encourage to attend groups. Q 15 minute checks were done for safety.   R:Pt attends groups and interacts well with peers and staff. Pt is taking medication. Pt has no complaints at this time.Pt receptive to treatment and safety maintained on unit.

## 2013-07-25 DIAGNOSIS — F1994 Other psychoactive substance use, unspecified with psychoactive substance-induced mood disorder: Secondary | ICD-10-CM

## 2013-07-25 MED ORDER — DIVALPROEX SODIUM ER 250 MG PO TB24
750.0000 mg | ORAL_TABLET | Freq: Every day | ORAL | Status: DC
  Filled 2013-07-25: qty 42

## 2013-07-25 MED ORDER — METOPROLOL TARTRATE 25 MG PO TABS: 25.0000 mg | ORAL_TABLET | Freq: Two times a day (BID) | ORAL | Status: AC

## 2013-07-25 MED ORDER — HALOPERIDOL 5 MG PO TABS: 5.0000 mg | ORAL_TABLET | Freq: Four times a day (QID) | ORAL | Status: AC | PRN

## 2013-07-25 MED ORDER — FLUOXETINE HCL 20 MG PO CAPS: 20.0000 mg | ORAL_CAPSULE | Freq: Every day | ORAL | Status: AC

## 2013-07-25 MED ORDER — TRAZODONE HCL 50 MG PO TABS: 50.0000 mg | ORAL_TABLET | Freq: Every evening | ORAL | Status: AC | PRN

## 2013-07-25 MED ORDER — COLCHICINE 0.6 MG PO TABS: 0.6000 mg | ORAL_TABLET | Freq: Every day | ORAL | Status: AC

## 2013-07-25 MED ORDER — DIVALPROEX SODIUM ER 250 MG PO TB24: 750.0000 mg | ORAL_TABLET | Freq: Every day | ORAL | Status: AC

## 2013-07-25 NOTE — [facility] Suicide Risk Assessment (Signed)
   Demographic Factors:  Male, Divorced or widowed and Caucasian  Total Time spent with patient: 30 minutes  Psychiatric Specialty Exam: Physical Exam  ROS  Blood pressure 124/90, pulse 80, temperature 97.3 F (36.3 C), temperature source Oral, resp. rate 18, height 6' (1.829 m), weight 224 lb (101.606 kg).Body mass index is 30.37 kg/(m^2).  General Appearance: Casual  Eye Contact::  Good  Speech:  Clear and Coherent  Volume:  Normal  Mood:  Anxious  Affect:  Congruent  Thought Process:  Logical  Orientation:  Full (Time, Place, and Person)  Thought Content:  Rumination  Suicidal Thoughts:  No  Homicidal Thoughts:  No  Memory:  Immediate;   Good Recent;   Good Remote;   Good  Judgement:  Intact  Insight:  Good  Psychomotor Activity:  Normal  Concentration:  Good  Recall:  Good  Fund of Knowledge:Fair  Language: Good  Akathisia:  No  Handed:  Right  AIMS (if indicated):     Assets:  Communication Skills Desire for Improvement Housing Social Support  Sleep:  Number of Hours: 6.5    Musculoskeletal: Strength & Muscle Tone: within normal limits Gait & Station: normal Patient leans: N/A   Mental Status Per Nursing Assessment::   On Admission:  Suicidal ideation indicated by patient;Suicide plan;Thoughts of violence towards others  Current Mental Status by Physician: Please see discharge summary.  Loss Factors: Loss of significant relationship  Historical Factors: Prior suicide attempts, Impulsivity and Drug use  Risk Reduction Factors:   Sense of responsibility to family, Employed, Positive social support, Positive therapeutic relationship and Positive coping skills or problem solving skills  Continued Clinical Symptoms:  Bipolar Disorder:   Mixed State  Cognitive Features That Contribute To Risk:  Polarized thinking    Suicide Risk:  Mild:  Suicidal ideation of limited frequency, intensity, duration, and specificity.  There are no identifiable plans,  no associated intent, mild dysphoria and related symptoms, good self-control (both objective and subjective assessment), few other risk factors, and identifiable protective factors, including available and accessible social support.  Discharge Diagnoses:   AXIS I:  Bipolar, mixed, alcohol abuse and substance-induced mood disorder AXIS II:  Deferred AXIS III:   Past Medical History  Diagnosis Date  . Bipolar 1 disorder   . CHF (congestive heart failure)   . Coronary artery disease   . Hypertension   . Depression    AXIS IV:  other psychosocial or environmental problems, problems related to social environment and problems with primary support group AXIS V:  51-60 moderate symptoms  Plan Of Care/Follow-up recommendations:  Activity:  As tolerated Diet:  Unchanged from the past  Is patient on multiple antipsychotic therapies at discharge:  No   Has Patient had three or more failed trials of antipsychotic monotherapy by history:  No  Recommended Plan for Multiple Antipsychotic Therapies: NA    ARFEEN,SYED T. 07/25/2013, 11:42 AM
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
## 2013-07-25 NOTE — Progress Notes (Signed)
Pt discharged per MD orders; pt currently denies SI/HI and auditory/visual hallucinations; pt was given education by RN regarding follow-up appointments and medications and pt denied any questions or concerns about these instructions; pt was then escorted to search room to retrieve his belongings by RN before being discharged to hospital lobby. 

## 2013-07-26 NOTE — ED Provider Notes (Signed)
Medical screening examination/treatment/procedure(s) were performed by non-physician practitioner and as supervising physician I was immediately available for consultation/collaboration.  EKG Interpretation   None       Pt with hx depression, recent tx for same, states became upset after argument w family member.  Feeling more depressed. +SI. No HI.  Currently w normal mood/affect. Calm and alert. Psych team consult.    Oracio Galen E SteinSuzi Rootsl, MD 07/26/13 [phone]

## 2013-07-30 NOTE — Progress Notes (Signed)
Patient Discharge Instructions:  After Visit Summary (AVS):   Faxed to:  07/30/13 Discharge Summary Note:   Faxed to:  05/29/14 Psychiatric Admission Assessment Note:   Faxed to:  05/29/14 Suicide Risk Assessment - Discharge Assessment:   Faxed to:  05/29/14 Faxed/Sent to the Next Level Care provider:  05/29/14 [facility]axed to [facility] @ [phone]  Jerelene ReddenSheena E [place], 07/30/2013, 1:24 PM

## 2015-02-11 IMAGING — CR DG ANKLE COMPLETE 3+V*L*
3 series · 3 of 3 positions shown · non-contrast
Comparison: None.

CLINICAL DATA: Lateral pain for 1 day no injury

EXAM:
LEFT ANKLE COMPLETE - 3+ VIEW

[x ankle ap left]
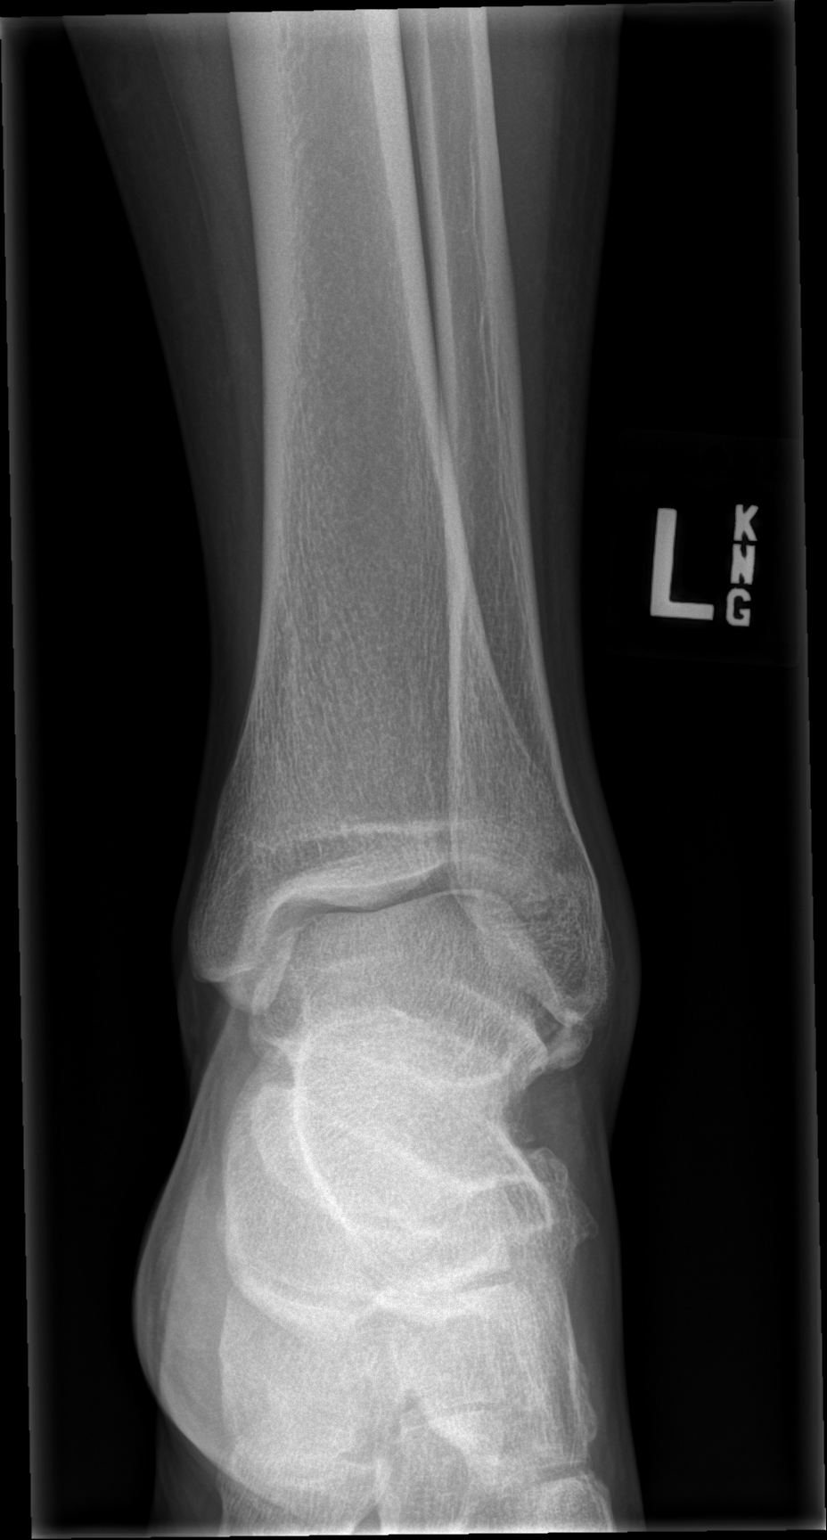

[x ankle obl left]
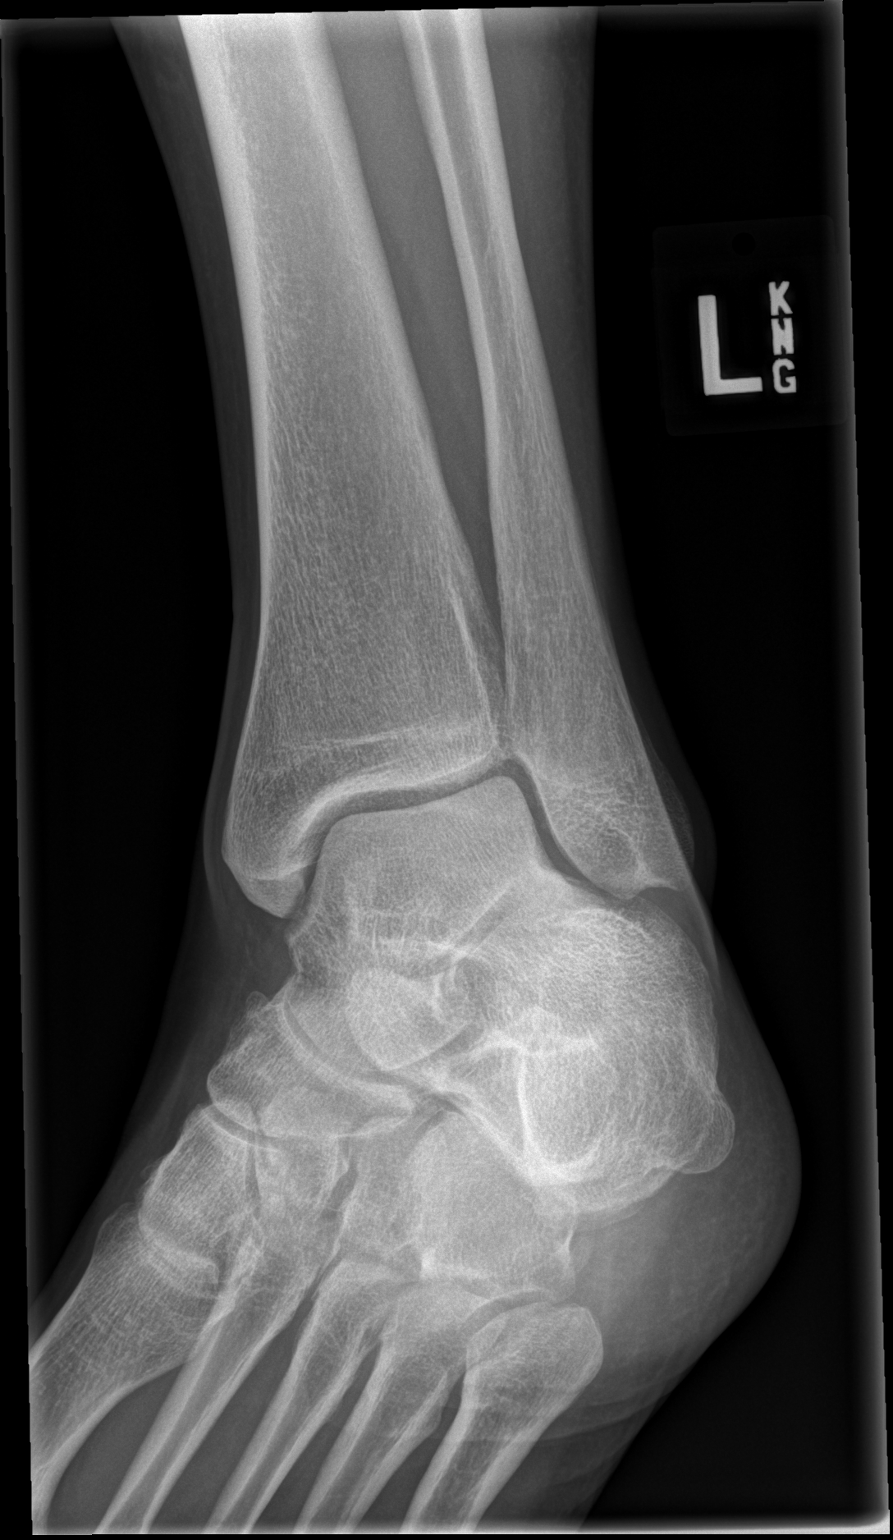

[x ankle lat left]
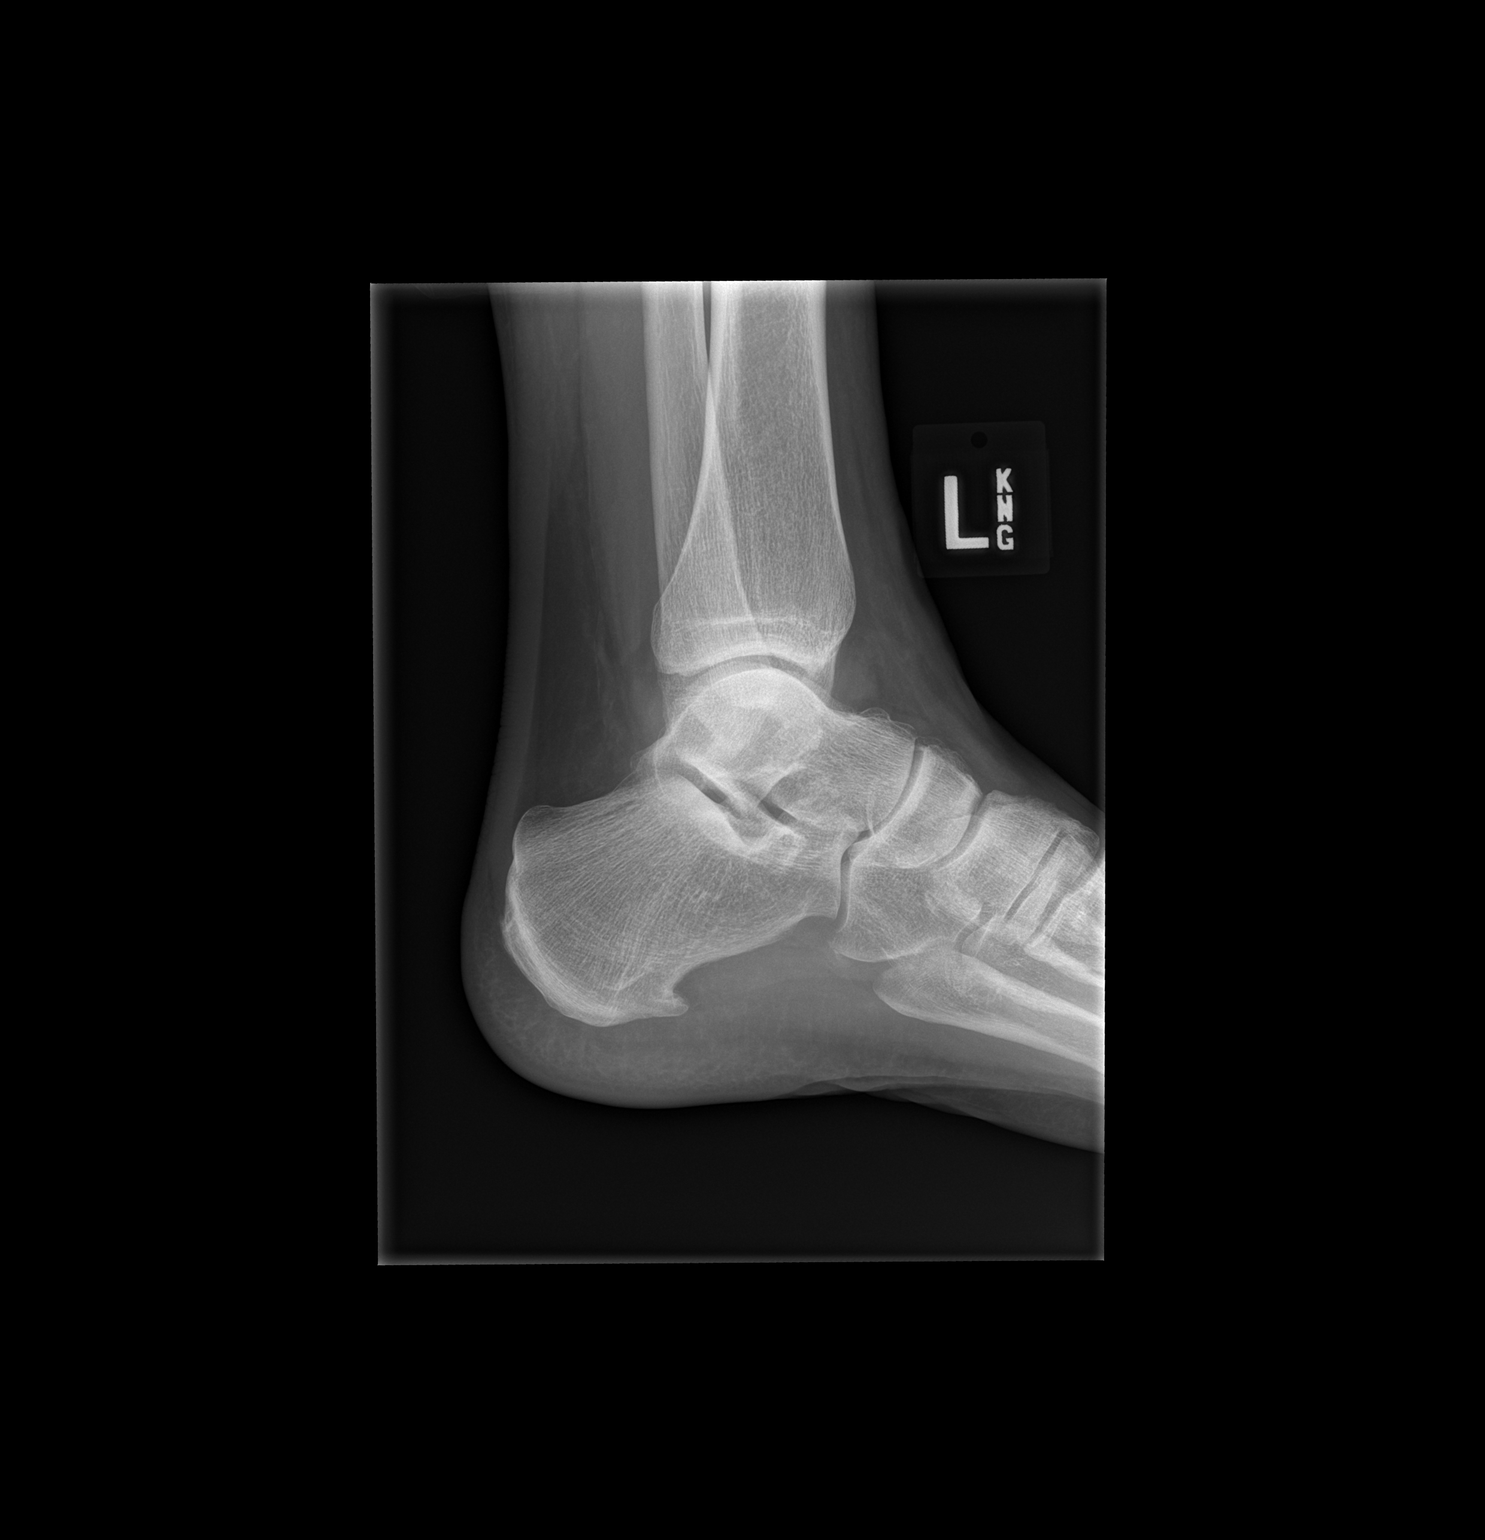

[3 of 3 positions shown; findings below may reference images not displayed]

FINDINGS: There is an ossicle off of the lateral malleolus. There is no
fracture or dislocation. The mortise is intact. There is no soft
tissue swelling. There is a small ankle joint effusion. There is a
small heel spur.
IMPRESSION: Small ankle joint effusion.  No acute osseous abnormalities.
